# Patient Record
Sex: Female | Born: 1960 | Race: White | Hispanic: No | Marital: Married
Health system: Northeastern US, Academic
[De-identification: ages and names within clinical notes are randomized; demographics above are authoritative.]

---

## 2019-05-24 IMAGING — MG MAMMOGRAPHY SCREENING BILATERAL 3D TOMOSYNTHESIS WITH CAD
8 series · 8 of 24 positions shown · non-contrast
Comparison: Comparison was made to prior exams.

MAMMOGRAPHY SCREENING BILATERAL 3D TOMOSYNTHESIS WITH CAD, 05/24/2019 [DATE]: 
CLINICAL INDICATION: Screening exam.
TECHNIQUE: Digital bilateral mammograms and 3-D Tomosynthesis were obtained. 
These were interpreted both primarily and with the aid of computer-aided 
detection system.

[L CC]
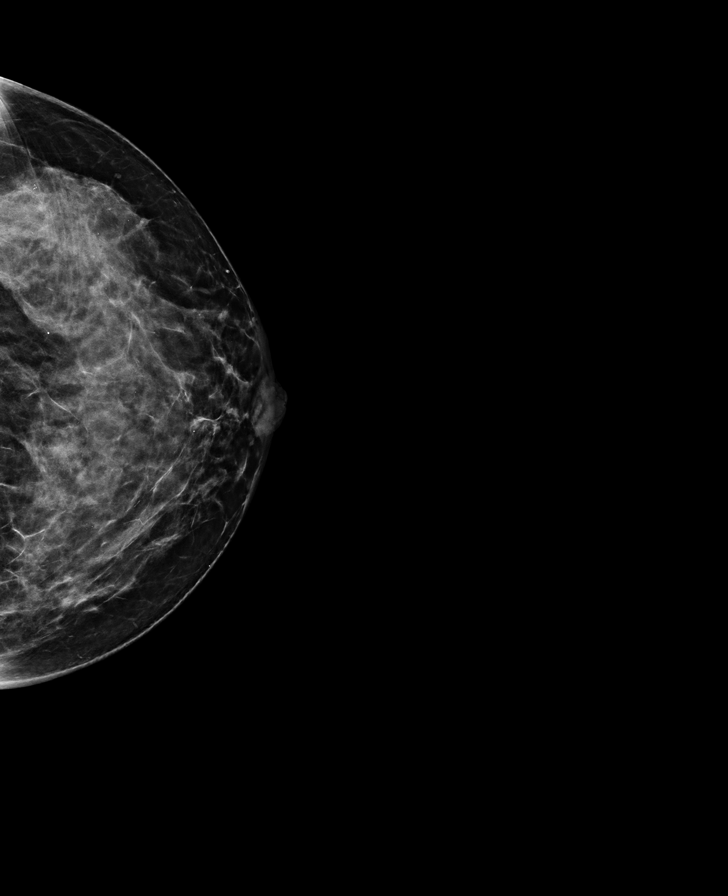

[L MLO]
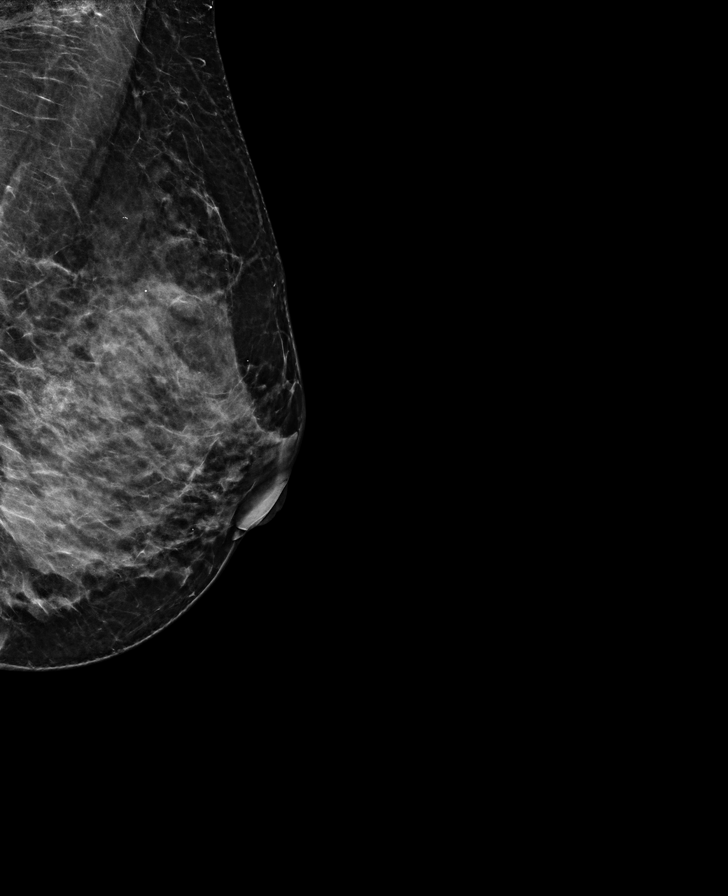

[R MLO]
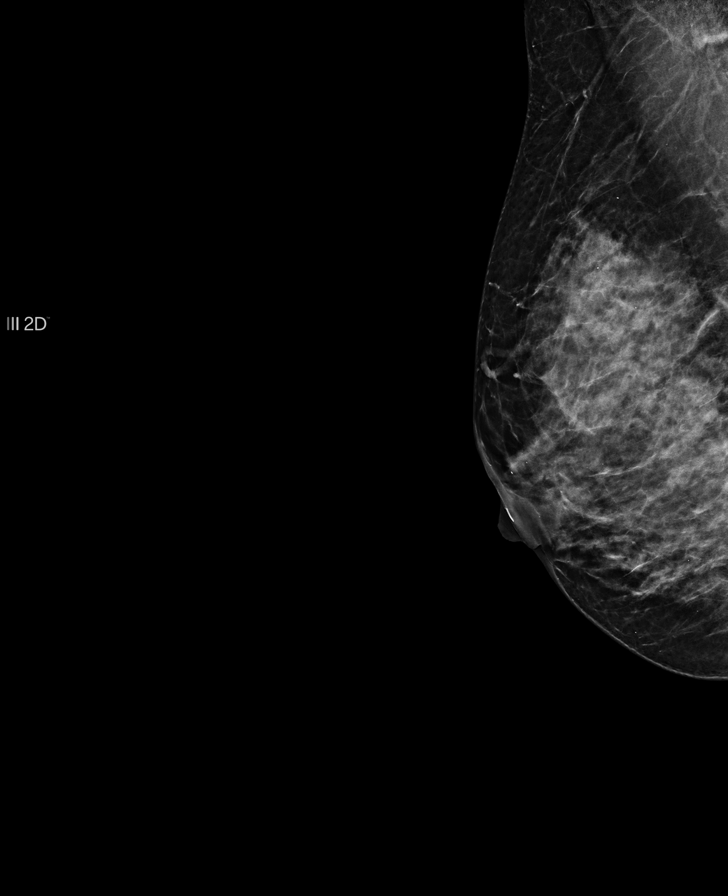

[R CC]
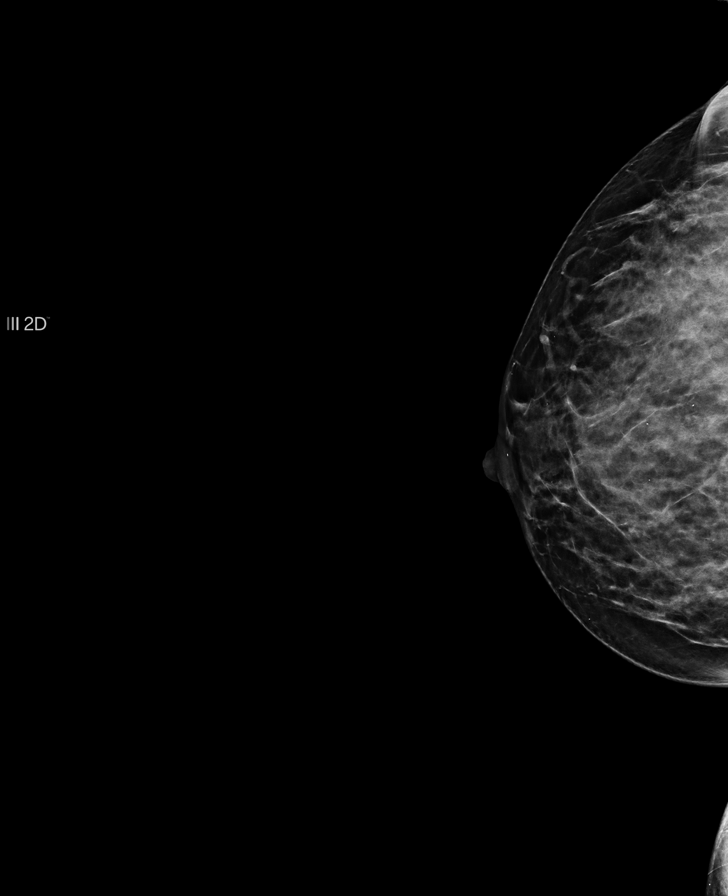

[R CC tomo · tomo slice 33/66.0]
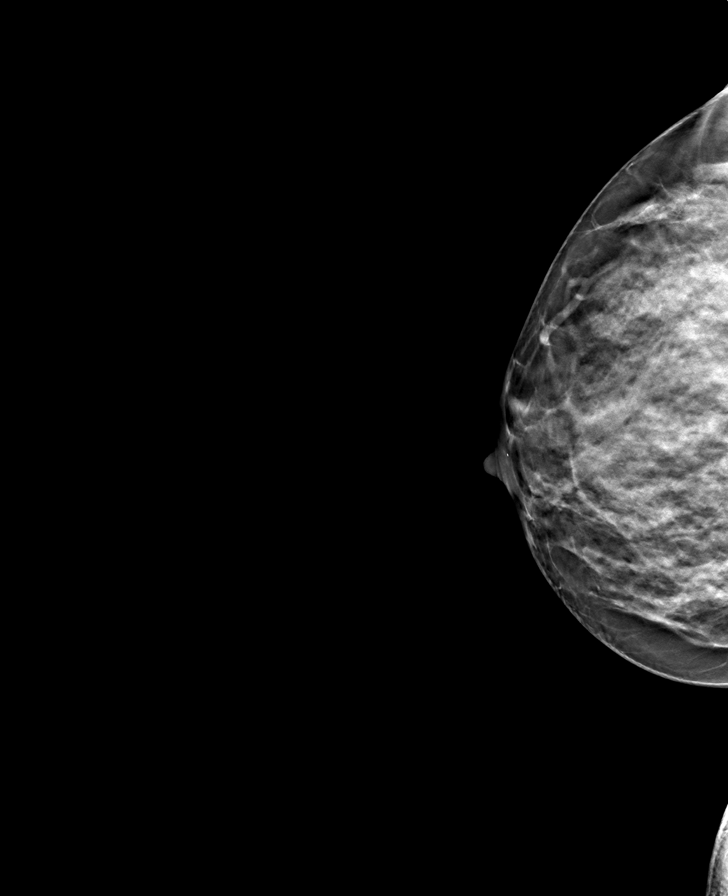

[R MLO tomo · tomo slice 32/63.0]
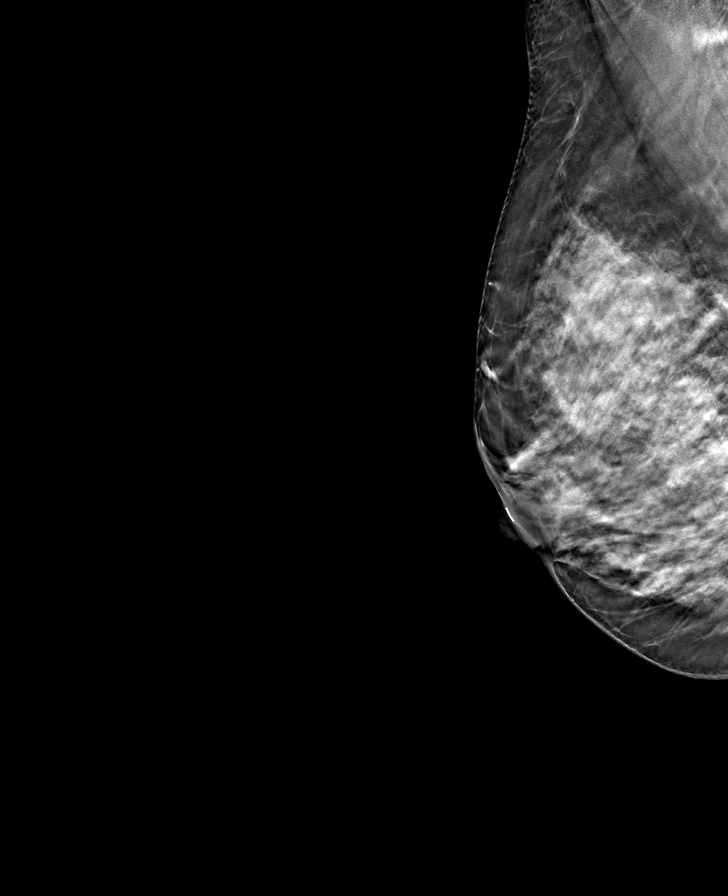

[L MLO tomo · tomo slice 33/64.0]
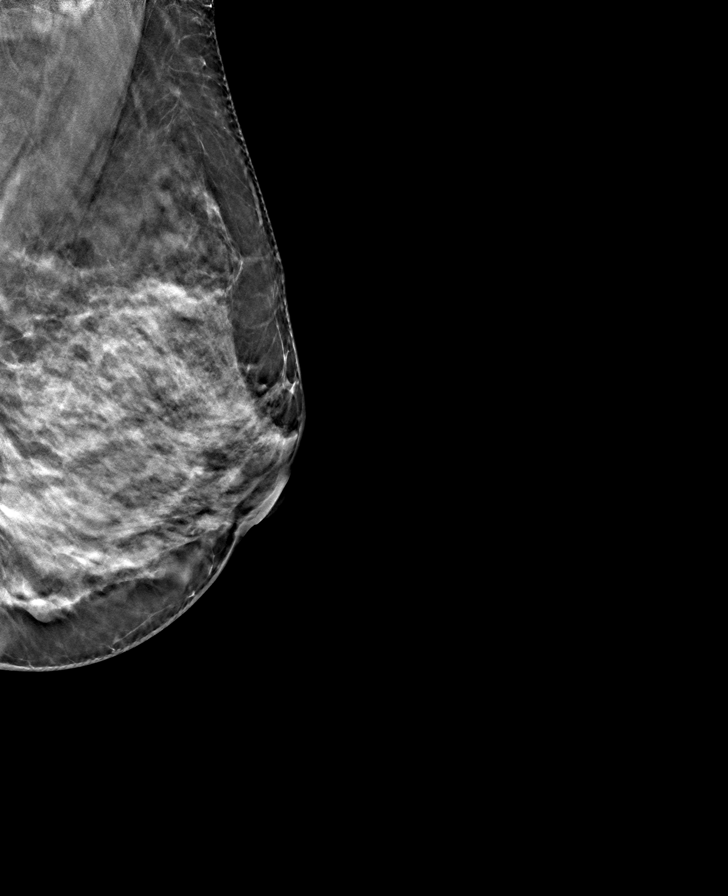

[L CC tomo · tomo slice 37/72.0]
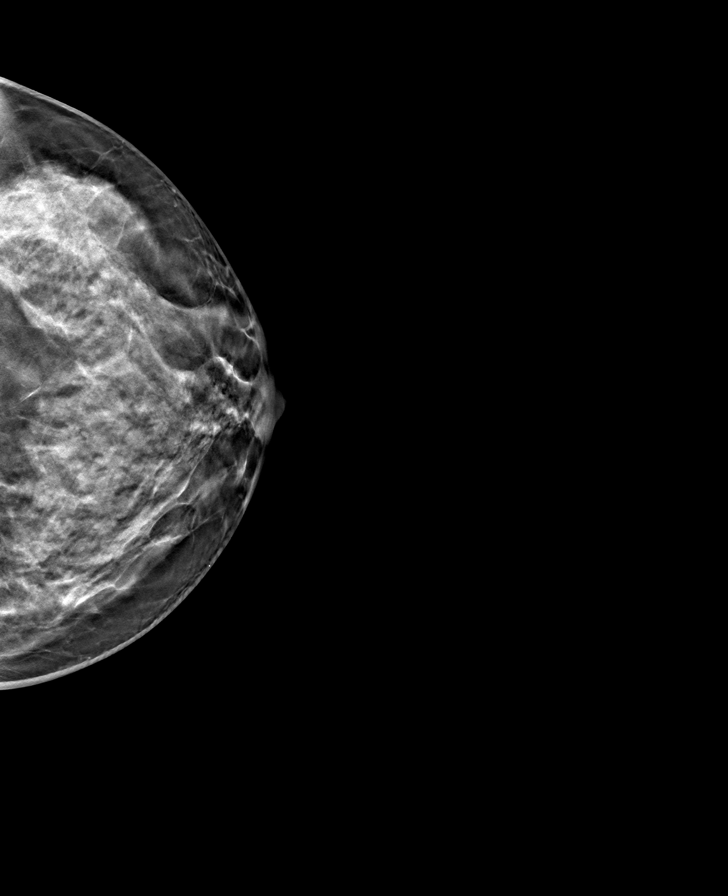

[8 of 24 positions shown; findings below may reference images not displayed]

BREAST DENSITY: (Level C) The breasts are heterogeneously dense, which may 
obscure small masses.
FINDINGS: No suspicious mass, calcifications, or area of architectural 
distortion in either breast.
IMPRESSION: No mammographic evidence of malignancy in either breast. 
( BI-RADS 1) Negative mammogram. Routine mammographic follow-up is recommended.

## 2019-11-03 IMAGING — MR MRI BRAIN WITHOUT CONTRAST
5 of 9 series · 26 of 48 positions shown · non-contrast
Comparison: None

MRI BRAIN WITHOUT CONTRAST, 11/03/2019 [DATE]: 
CLINICAL INDICATION: Seizures
TECHNIQUE: Axial T1, Axial T2, Axial FLAIR, Diffusion weighted images, Sagittal 
T1, and Coronal T2 MR images of the brain were performed without intravenous 
contrast enhancement.

[Series 101: survey · axial · 10.0mm · 0.98mm/px · z∈[+0,+125]mm · 2 of 5 slices shown]
[im 1/5]
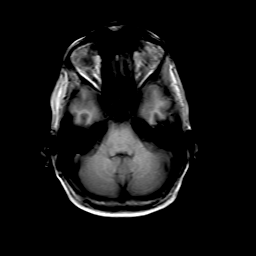
[im 5/5]
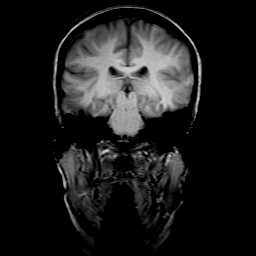

[Series 401: FLAIR · axial · 5.0mm · 0.65mm/px · z∈[-83,+63]mm · 4 of 27 slices shown]
[im 1/27]
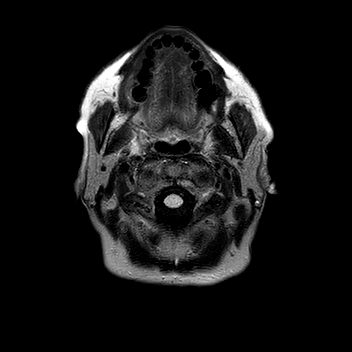
[im 9/27]
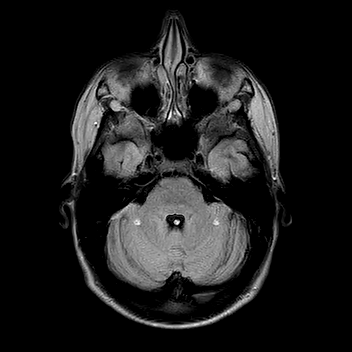
[im 18/27]
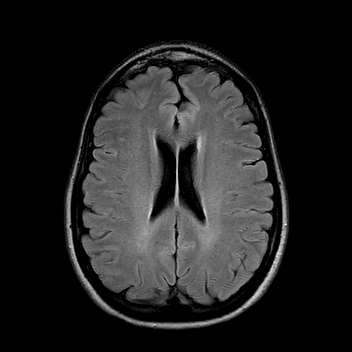
[im 27/27]
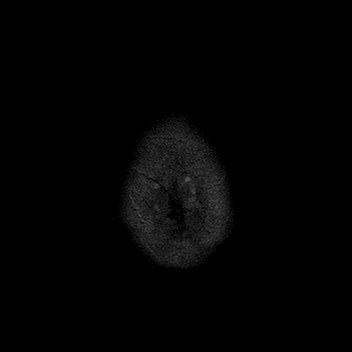

[Series 601: SWI · axial · 3.0mm · 0.53mm/px · z∈[-73,+62]mm · 10 of 103 slices shown]
[im 7/103]
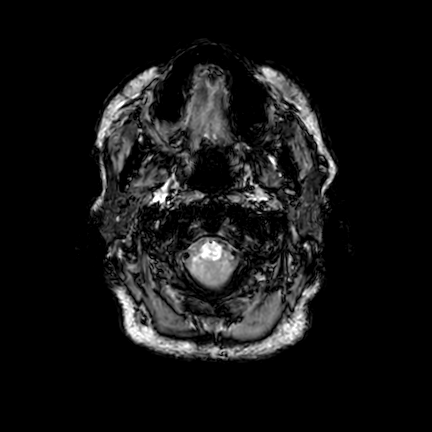
[im 14/103]
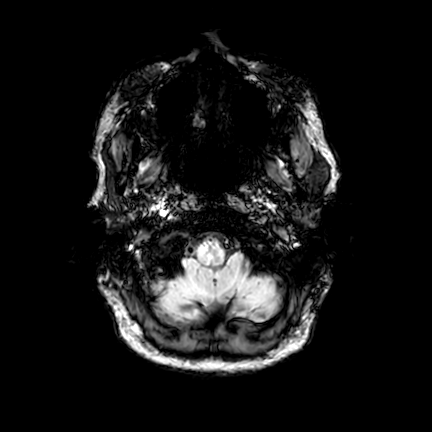
[im 21/103]
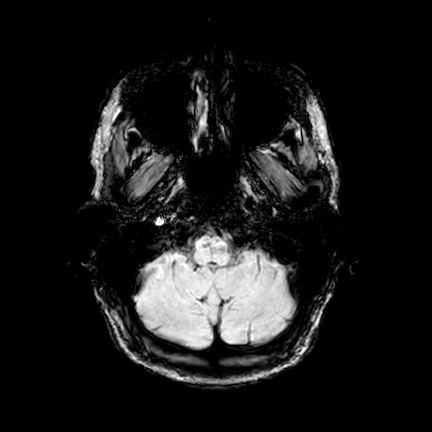
[im 35/103]
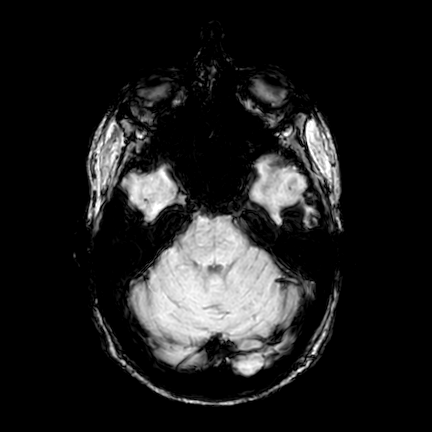
[im 48/103]
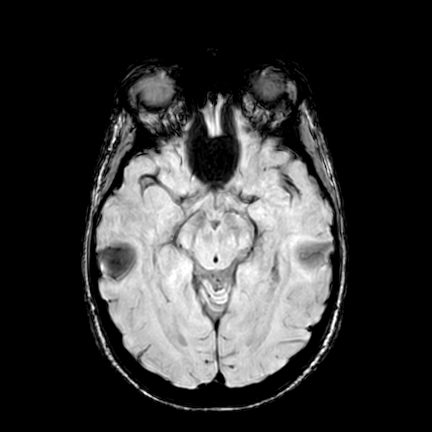
[im 55/103]
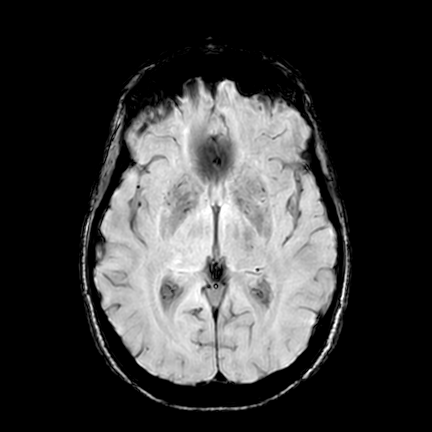
[im 62/103]
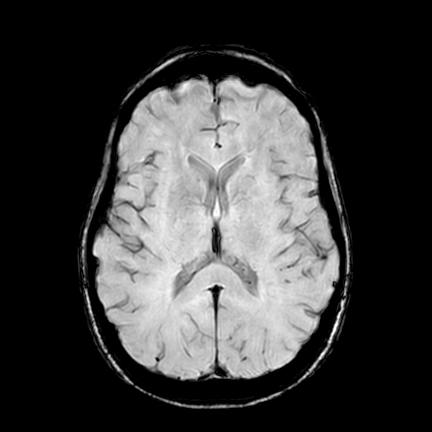
[im 75/103]
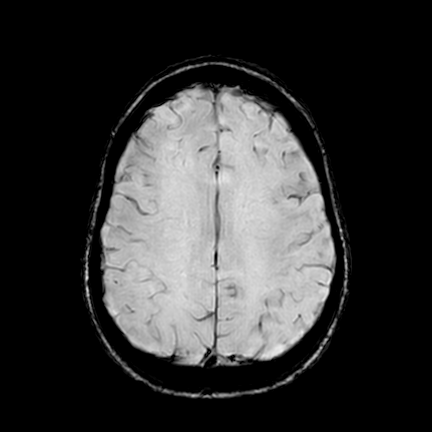
[im 89/103]
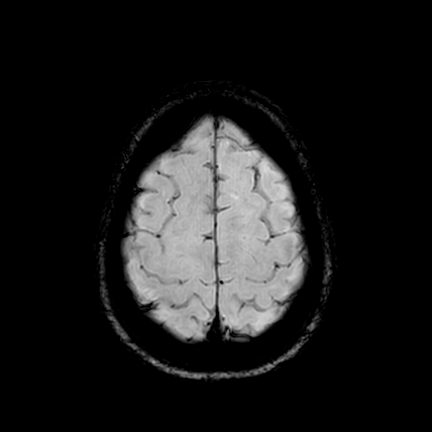
[im 103/103]
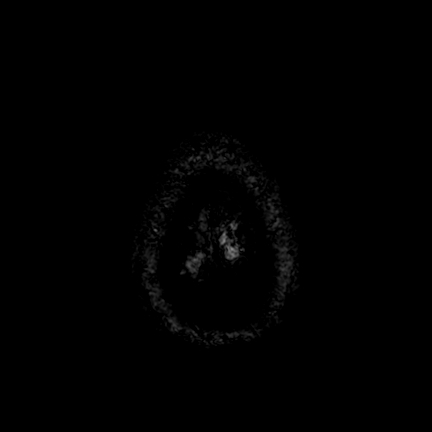

[Series 701: T2 · axial · 5.0mm · 0.41mm/px · z∈[-83,+63]mm · 4 of 27 slices shown (1 of 2)]
[im 1/27]
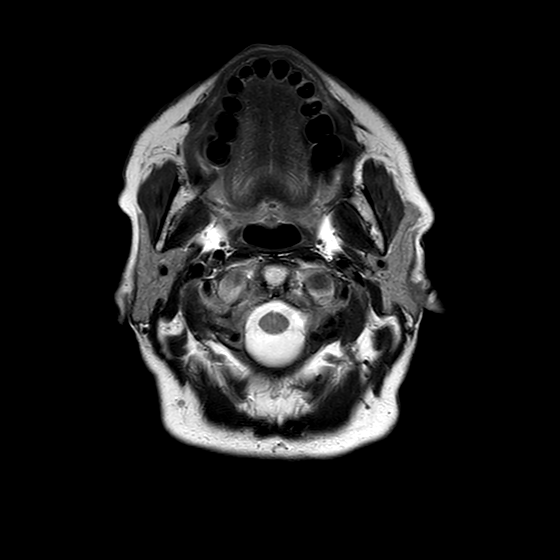
[im 9/27]
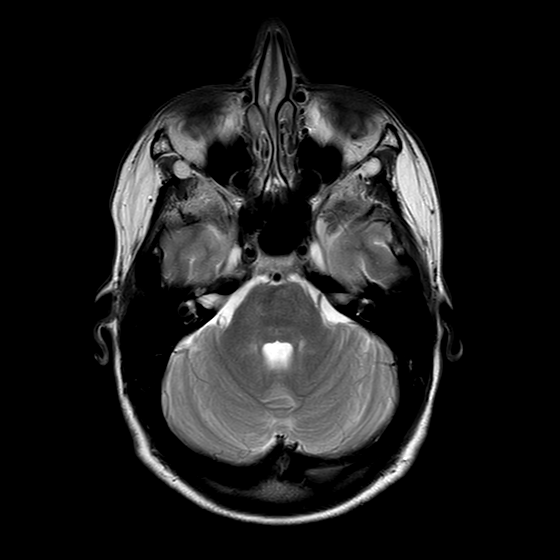
[im 18/27]
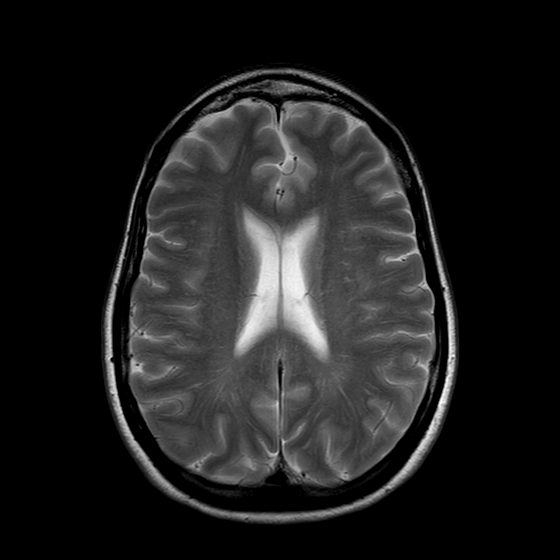
[im 27/27]
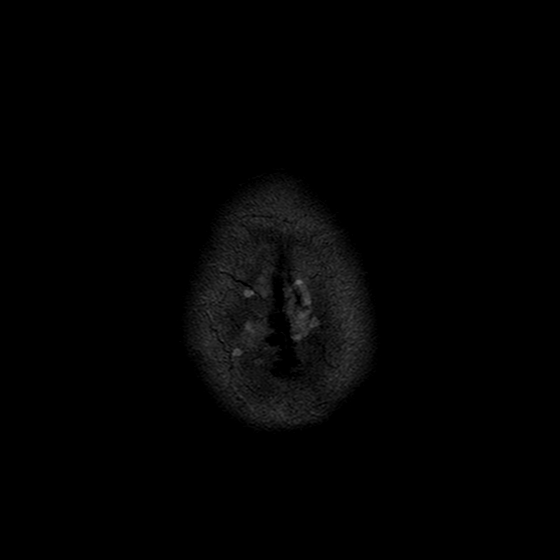

[Series 801: T2 · coronal · 4.0mm · 0.47mm/px · 6 of 36 slices shown (2 of 2)]
[im 1/36]
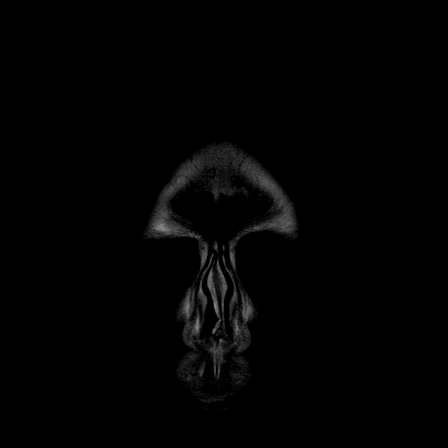
[im 8/36]
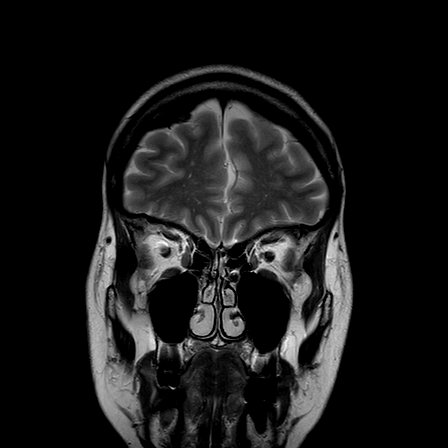
[im 15/36]
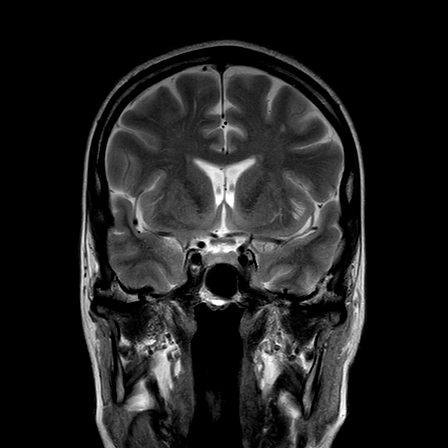
[im 22/36]
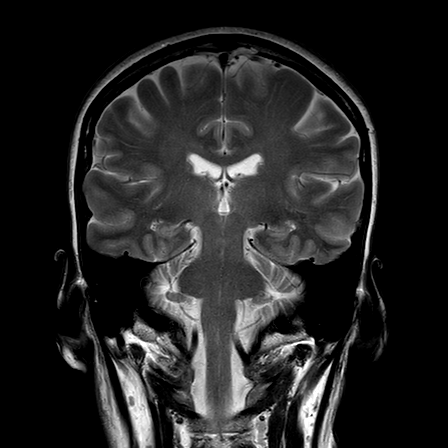
[im 29/36]
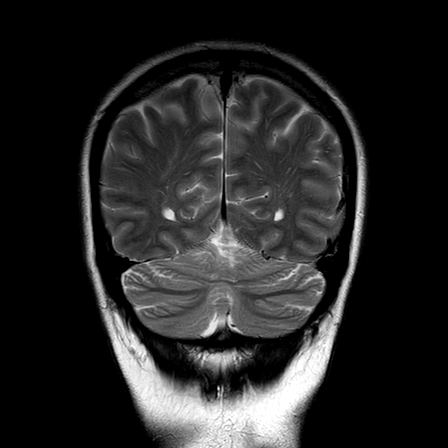
[im 36/36]
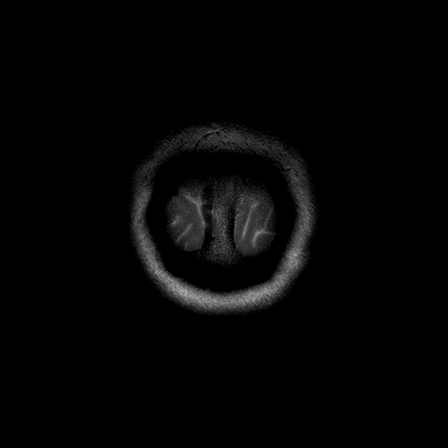

[26 of 48 positions shown; findings below may reference images not displayed]

FINDINGS: Diffusion images are negative. FLAIR sequences show mild to moderate 
chronic-appearing white matter microangiopathic changes, with scattered foci in 
the subcortical white matter, appearing most pronounced in the anterior 
subinsular zones bilaterally. There is no discrete brainstem or cerebellar 
lesion. No hydrocephalus. Major intracranial arterial segments are open. No 
aneurysm or vascular malformation. Major dural sinuses appear open. No evidence 
for neuronal migration anomaly. There is no significant atrophy. Hippocampal and 
mesiotemporal structures are unremarkable. The craniocervical junction is open. 
Sellar contents normal. Susceptibility images show no parenchymal hemosiderin. 
There is mild hyperostosis frontalis interna. Paranasal sinuses and otomastoid 
spaces appear clear. There is no orbital mass.
IMPRESSION: Unremarkable cranial MRI. No evidence for infarct, intracranial mass, migration 
anomaly, or other discrete epileptogenic focus. If warranted follow-up with 
enhanced images would be useful for further evaluation. 
Mild to moderate chronic-appearing cerebral white matter microangiopathic foci.

## 2019-11-03 IMAGING — MR MRI CERVICAL SPINE WITHOUT CONTRAST
5 series · 34 of 48 positions shown · IV contrast (gadolinium)
Comparison: None prior of the cervical spine.

MRI CERVICAL SPINE WITHOUT CONTRAST, 11/03/2019 [DATE]: 
CLINICAL INDICATION: History of grand mal seizures. Involuntary movement 
involving the neck and legs for 3 months. Last seizure 6 years ago. History of 
melanoma.
TECHNIQUE: Multiplanar, multiecho position MR images of the cervical spine were 
performed without intravenous gadolinium enhancement.

[Series 201: survey · axial · 10.0mm · 0.94mm/px · z∈[-44,+121]mm · 5 of 9 slices shown]
[im 1/9]
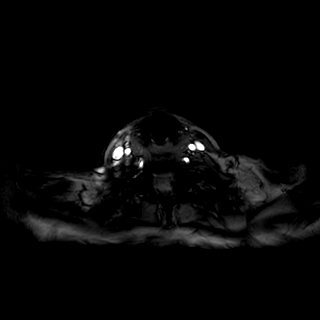
[im 3/9]
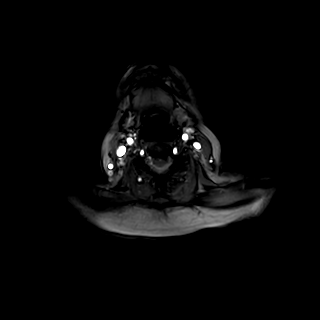
[im 5/9]
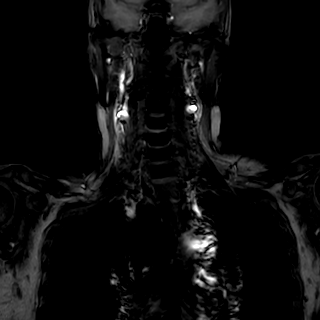
[im 7/9]
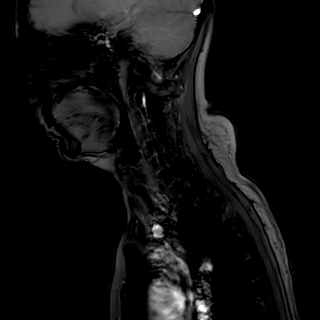
[im 9/9]
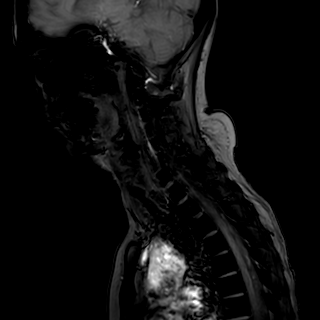

[Series 301: t1w_tse sag · sagittal · 3.0mm · 0.50mm/px · 7 of 15 slices shown]
[im 1/15]
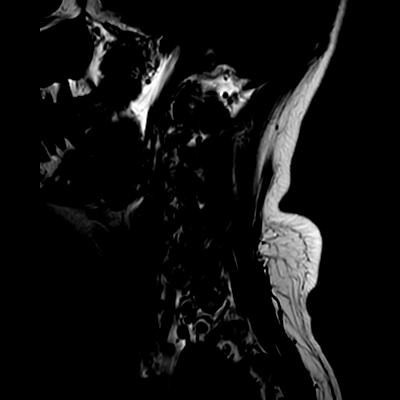
[im 3/15]
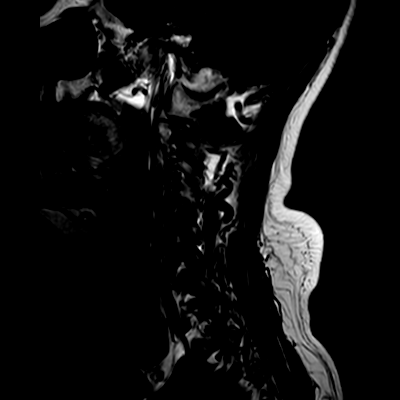
[im 5/15]
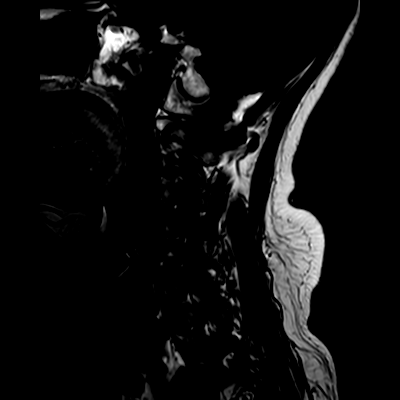
[im 8/15]
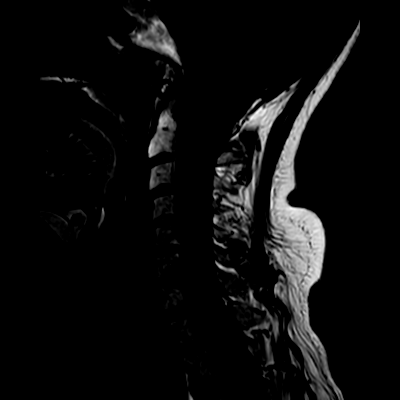
[im 10/15]
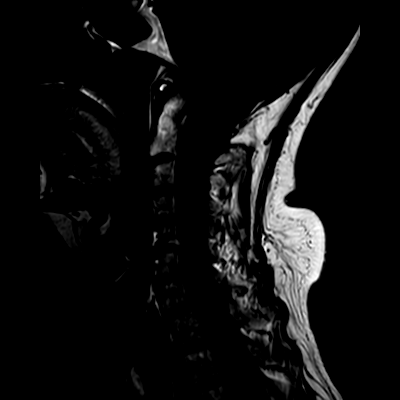
[im 12/15]
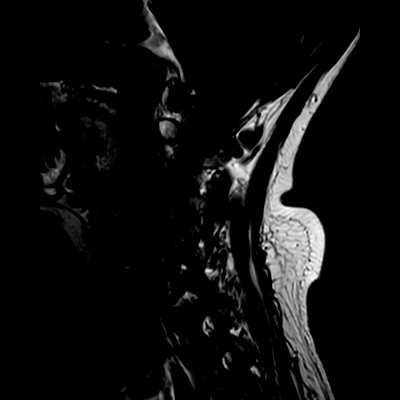
[im 15/15]
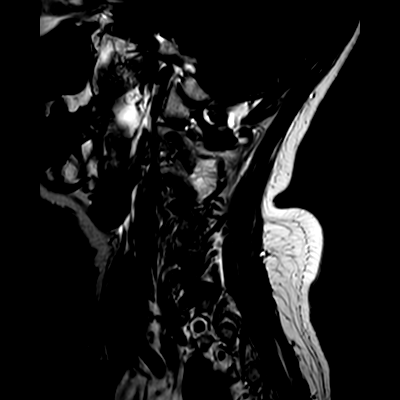

[Series 401: t2w_tse sag · sagittal · 3.0mm · 0.50mm/px · 7 of 15 slices shown]
[im 1/15]
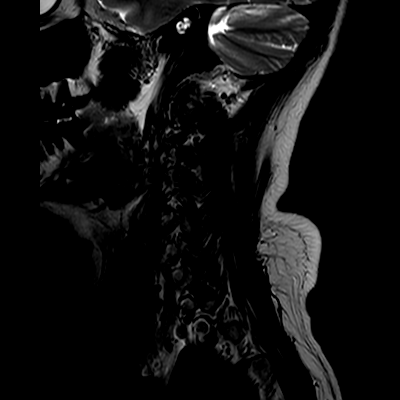
[im 3/15]
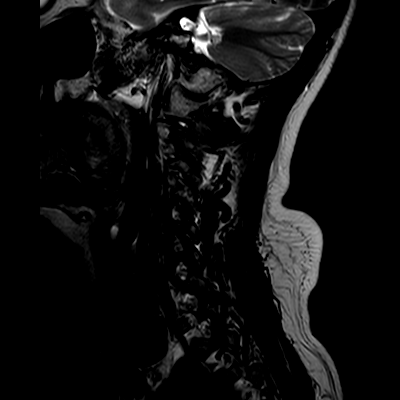
[im 5/15]
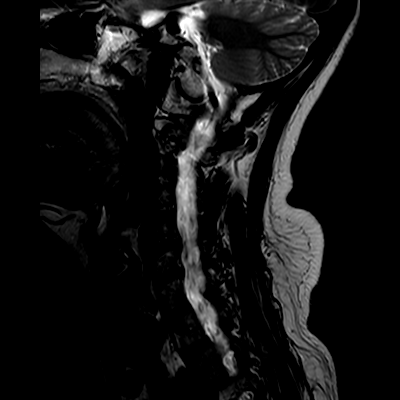
[im 8/15]
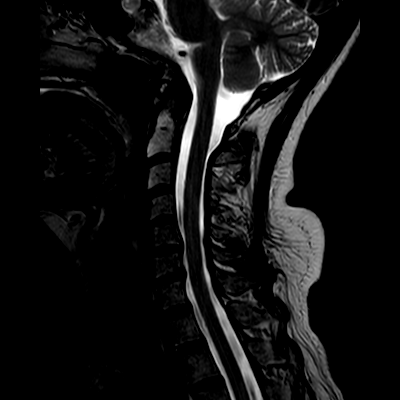
[im 10/15]
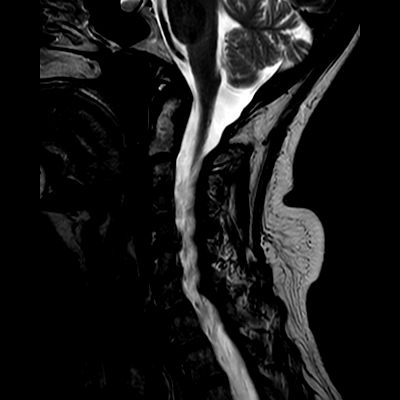
[im 12/15]
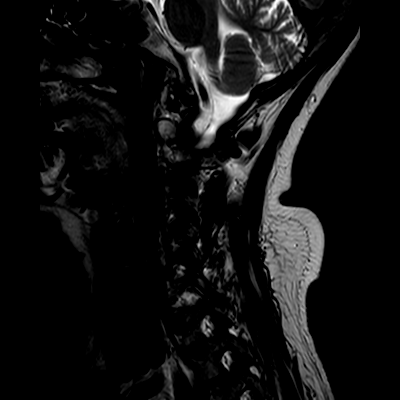
[im 15/15]
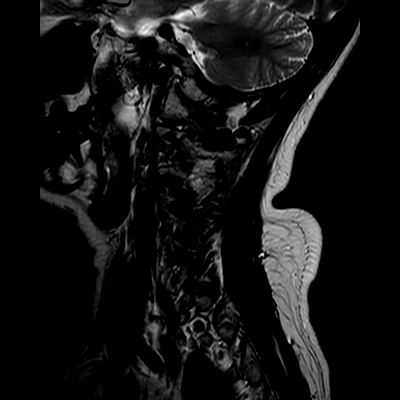

[Series 501: stir_longte sag · sagittal · 3.0mm · 0.62mm/px · 7 of 15 slices shown]
[im 1/15]
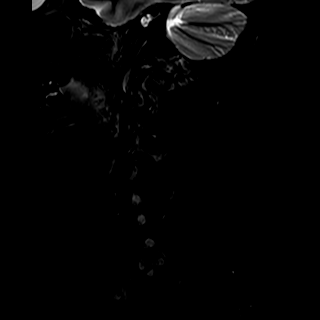
[im 3/15]
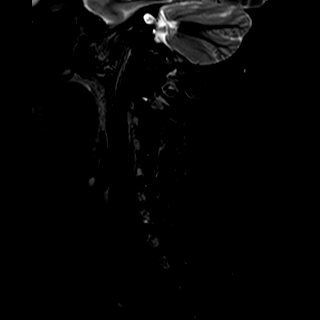
[im 5/15]
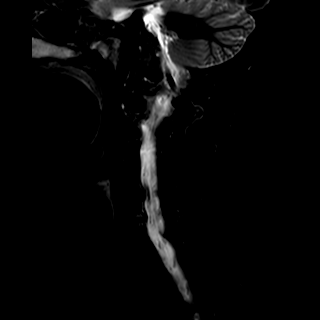
[im 8/15]
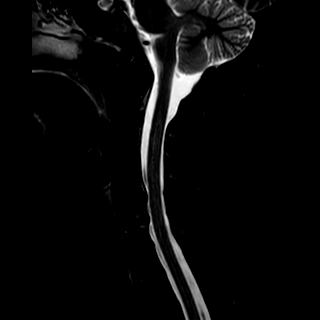
[im 10/15]
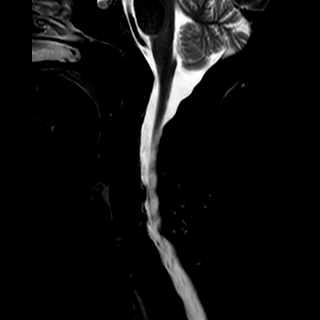
[im 12/15]
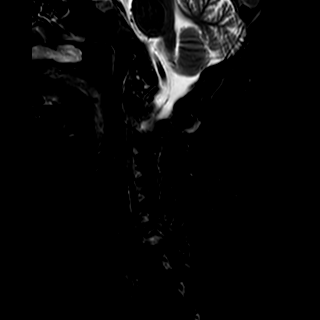
[im 15/15]
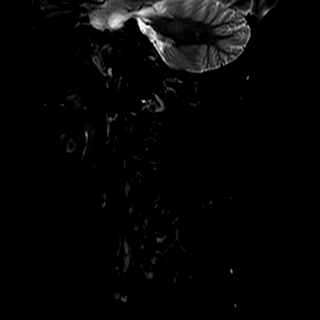

[Series 601: t2w_tse_ax · axial · 3.0mm · 0.36mm/px · z∈[-83,+27]mm · 8 of 45 slices shown]
[im 3/45]
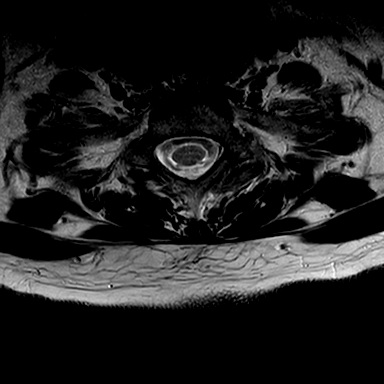
[im 9/45]
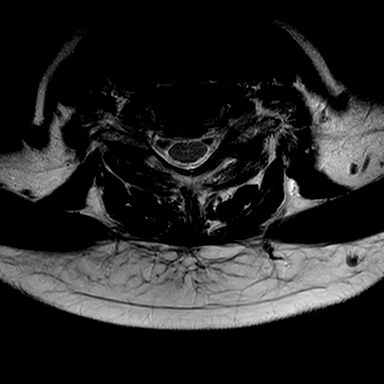
[im 13/45]
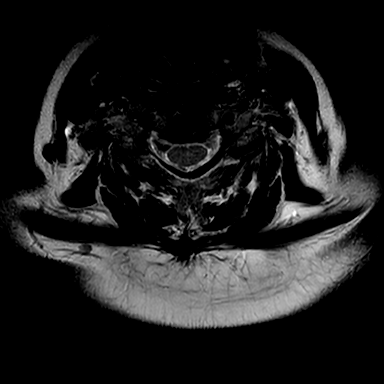
[im 19/45]
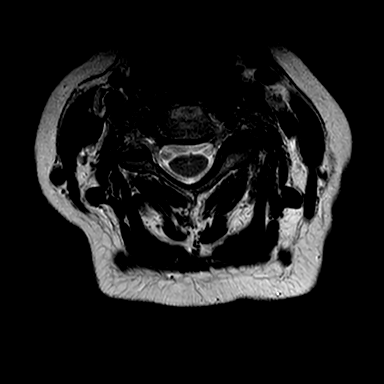
[im 26/45]
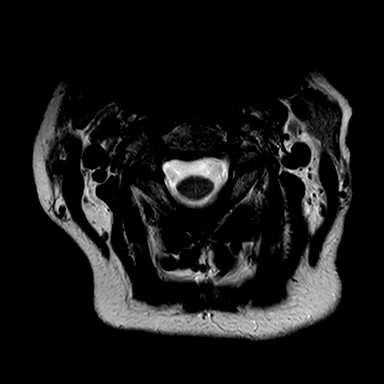
[im 32/45]
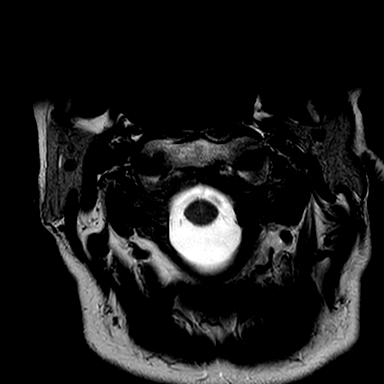
[im 36/45]
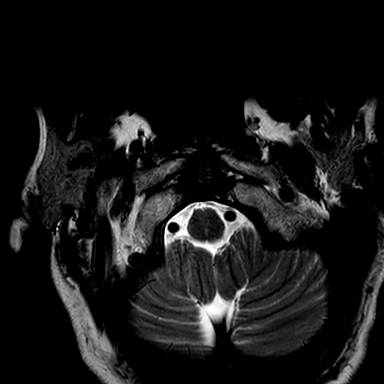
[im 42/45]
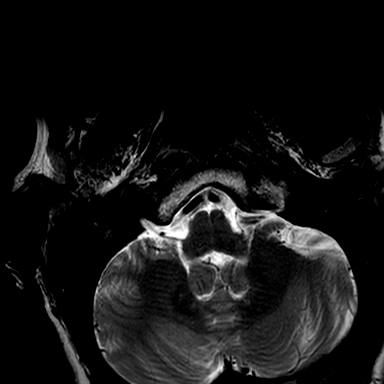

[34 of 48 positions shown; findings below may reference images not displayed]

FINDINGS: The vertebral bodies are normal in height and alignment. No vertebral 
body fracture. No spondylolisthesis. Normal bone marrow signal intensity. No 
signal abnormality or mass within the included portion of the spinal cord or 
spinal canal. The cerebellar tonsils are well located. The foramen magnum is 
negative. Included portions of the intracranial contents are negative. Posterior 
paraspinal soft tissues are negative. 
C2-C3: Mild disc desiccation without disc height loss. No disc herniation. 
Normal facets. No spinal canal or neural foraminal stenosis. 
C3-C4: Mild disc desiccation without disc height loss. Swelling arterial mild 
right posterior paracentral shallow disc protrusion. Normal facets. No spinal 
canal or neural foraminal stenosis. 
C4-C5: Mild disc desiccation without disc height loss. No disc herniation. Mild 
facet hypertrophy. No spinal canal or neural foraminal stenosis. 
C5-C6: Mild disc desiccation and disc height loss. Mild dorsal disc osteophyte 
complex. Mild facet hypertrophy. No spinal canal or neural foraminal stenosis. 
C6-C7: Mild disc desiccation and disc height loss. Mild dorsal disc osteophyte 
complex. Normal facets. No spinal canal or neural foraminal stenosis. 
C7-T1: Mild disc desiccation without disc height loss. No disc herniation. Mild 
facet hypertrophy. There is a small amount of edema like signal intensity within 
the marrow and soft tissues about the left facet is seen for example on the 
sagittal series, image 4.
IMPRESSION: 1.  Mild spondylotic changes cervical spine. 
2.  No spinal canal stenosis or neural foraminal stenosis. 
3.  There is facet arthropathy at C7-T1 with mild marrow and surrounding soft 
tissue edema about the left facet.

## 2020-05-10 ENCOUNTER — Encounter: Admit: 2020-05-10 | Payer: PRIVATE HEALTH INSURANCE | Attending: Vascular and Interventional Radiology

## 2020-05-24 IMAGING — MG MAMMOGRAPHY SCREENING BILATERAL 3D TOMOSYNTHESIS WITH CAD
6 series · 6 of 22 positions shown · non-contrast
Comparison: Comparison was made to prior exams.

MAMMOGRAPHY SCREENING BILATERAL 3D TOMOSYNTHESIS WITH CAD, 05/24/2020 [DATE]: 
CLINICAL INDICATION: Screening exam.
TECHNIQUE: Digital bilateral mammograms and 3-D Tomosynthesis were obtained. 
These were interpreted both primarily and with the aid of computer-aided 
detection system.

[R CC]
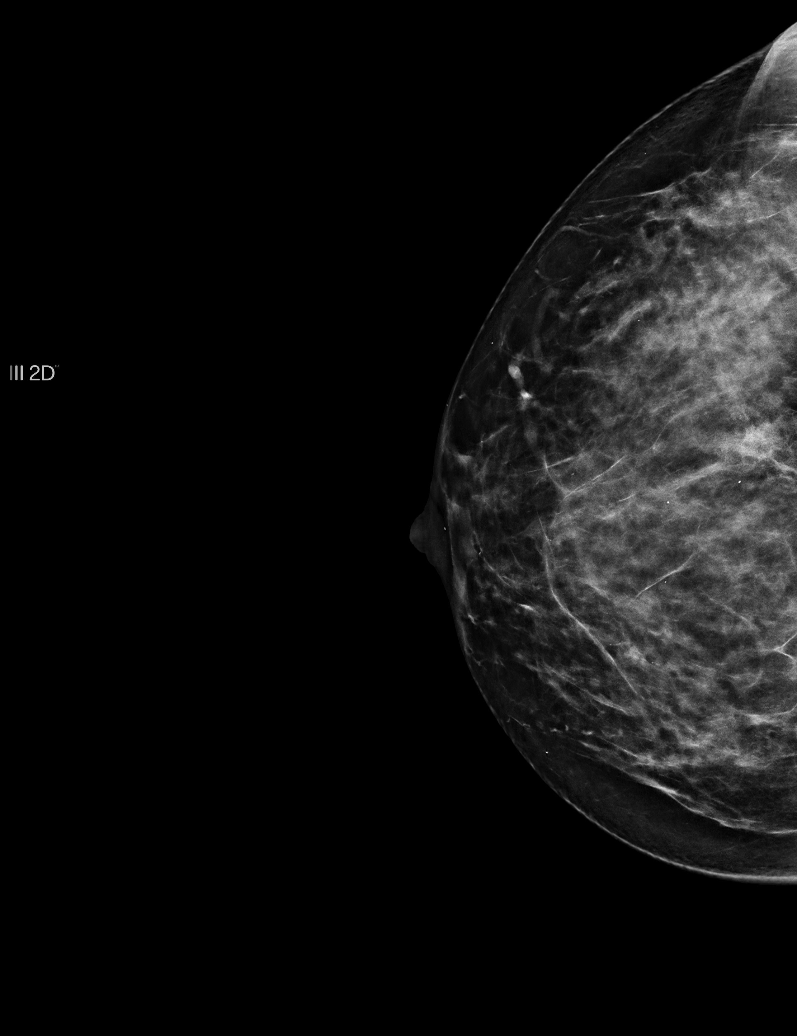

[L MLO]
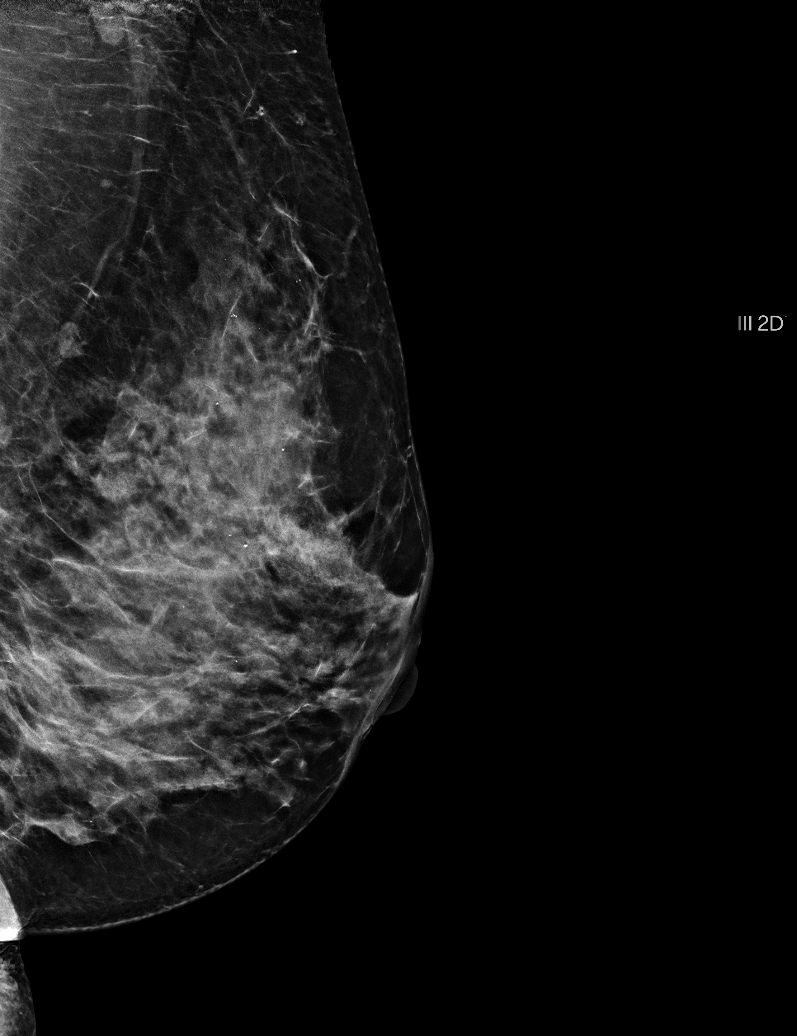

[L CC tomo · tomo slice 37/72.0]
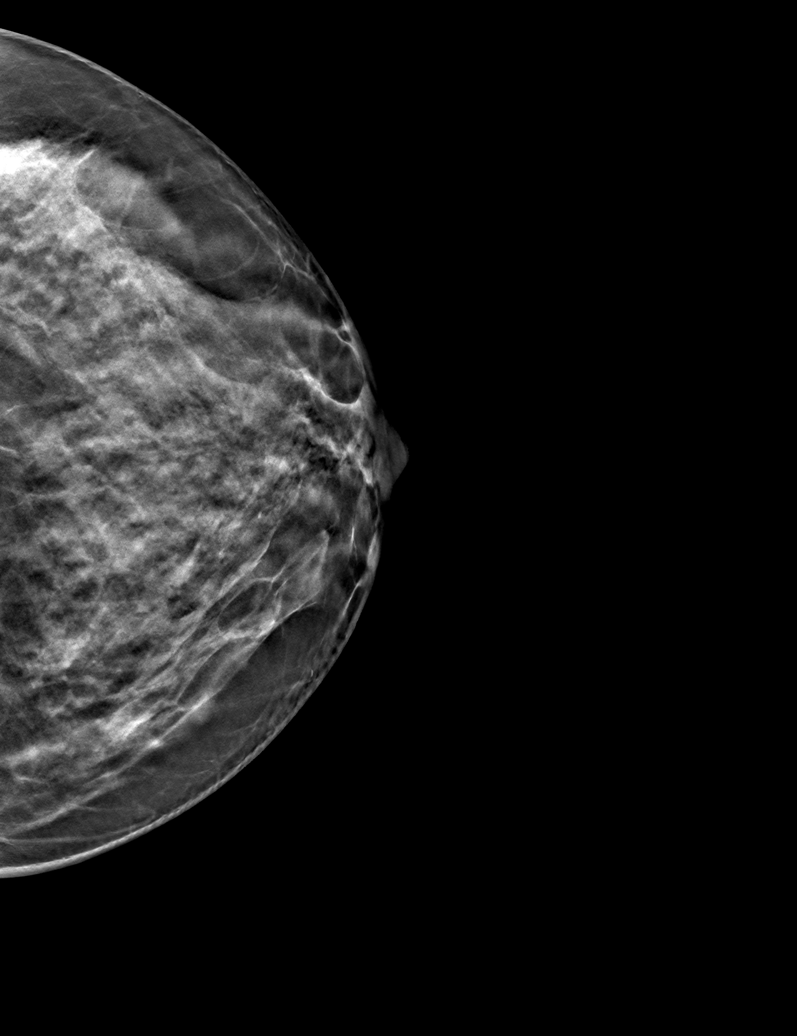

[L MLO tomo · tomo slice 36/71.0]
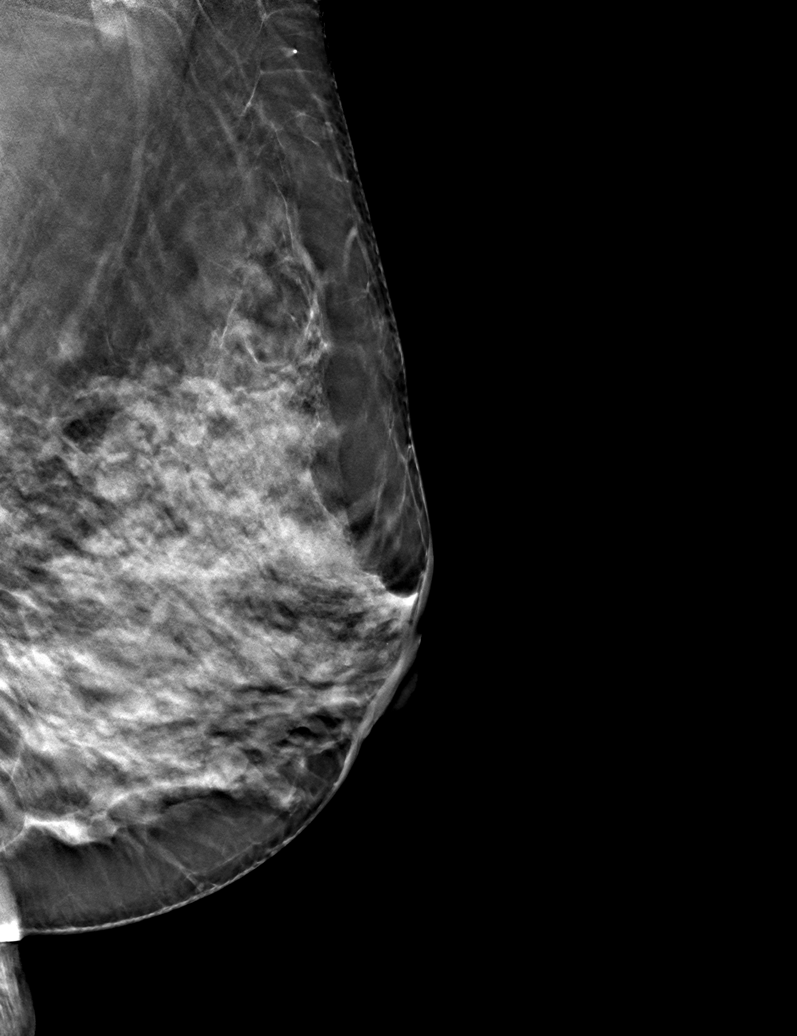

[R MLO tomo · tomo slice 39/76.0]
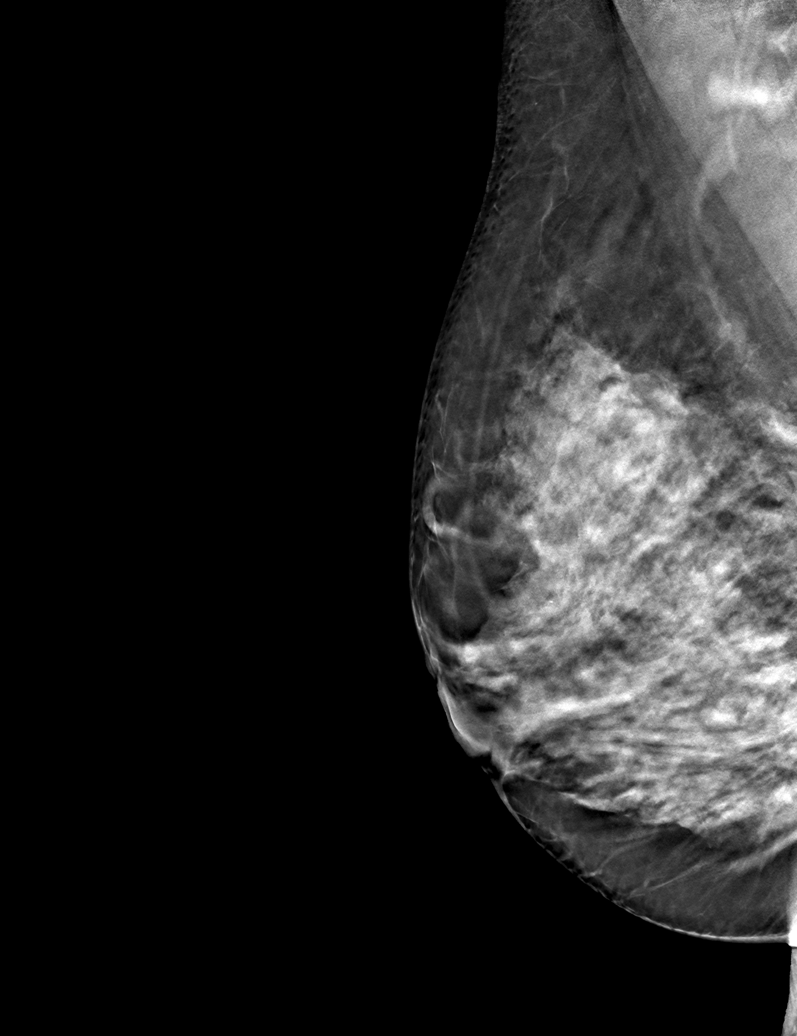

[R CC tomo · tomo slice 39/77.0]
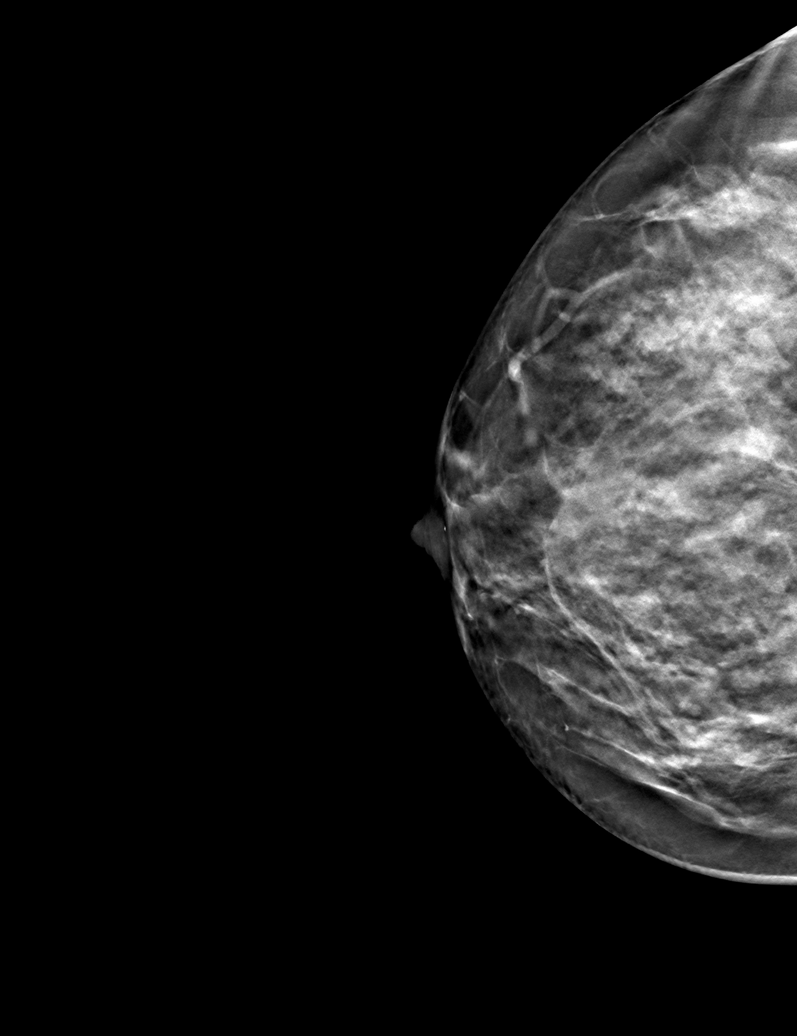

[6 of 22 positions shown; findings below may reference images not displayed]

BREAST DENSITY: (Level D) The breasts are extremely dense, which lowers the 
sensitivity of mammography.
FINDINGS: Extremely dense breast rectum is present.No suspicious mass, 
calcifications, or area of architectural distortion in either breast.
IMPRESSION: 1.  No mammographic evidence of malignancy in either breast. 
2.  Extremely dense breast parenchyma is present. This lowers the sensitivity of 
mammography for the detection of malignancy. Recommend calculating the patient's 
lifetime breast cancer risk via the NIH/NCI Breast cancer risk assessment tool: 
[URL] to determine which complementary modalities they 
may qualify for. 
( BI-RADS 2) Benign findings. Routine mammographic follow-up is recommended.

## 2020-06-21 ENCOUNTER — Encounter: Admit: 2020-06-21 | Payer: PRIVATE HEALTH INSURANCE | Attending: Vascular and Interventional Radiology

## 2020-06-26 ENCOUNTER — Encounter: Admit: 2020-06-26 | Payer: PRIVATE HEALTH INSURANCE | Attending: Vascular and Interventional Radiology

## 2020-07-03 ENCOUNTER — Encounter: Admit: 2020-07-03 | Payer: PRIVATE HEALTH INSURANCE | Attending: Vascular and Interventional Radiology

## 2020-07-03 IMAGING — CT PET CT SCAN TUMOR IMAGING SKULL TO THIGH
1 of 2 series · 1 of 25 positions shown · non-contrast
Comparison: No pertinent prior examination

PET CT SCAN TUMOR IMAGING SKULL TO THIGH, 07/03/2020 [DATE]: 
CLINICAL INDICATION:  Cutaneous T-cell lymphoma, positive biopsy on abdomen, 
initial staging
TECHNIQUE: A dose of 12.8 millicuries of 18-FDG was administered intravenously 
and skull to thigh PET scanning was performed at 60 minutes. Tomographic scans 
were reconstructed in axial, coronal, and sagittal projections. The data was 
reconstructed into a three-dimensional volume rendered images and reviewed in a 
rotational cine loop. Serum blood glucose at the time of injection was 104 
mg/dl.

[Series 6959: (wb_ctac) body · axial · 4.0mm · 4.00mm/px · 1 of 255 slices shown]
[im 139/255]
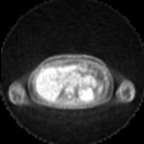

[1 of 25 positions shown; findings below may reference images not displayed]

FINDINGS: There is no abnormal FDG activity in cervical node bearing regions. 
The lower scalp, face and neck show no abnormal cutaneous activity. There is 
bilateral activity in the thyroid glands which are mildly enlarged. Clinical 
correlation advised as to potential thyroiditis. There is no abnormal pulmonary, 
mediastinal, axillary or supraclavicular FDG uptake. No focal concerning breast 
activity. There is physiologic excretion of contrast material by the left kidney 
into the bladder. The right kidney is absent. There is no concerning 
infradiaphragmatic or osseous activity. The spleen is not enlarged, vertical 
span 9.3 cm. No abnormal cutaneous uptake involving the thorax, abdomen or 
pelvis. There is pan colonic diverticulosis. There is no evidence for 
retroperitoneal or intra-abdominal adenopathy. There is no suspicious osseous 
activity. Uptake along the base of the left thumb is likely degenerative.
IMPRESSION: No abnormal FDG activity identified to indicate neoplastic involvement. There is 
no abnormal cutaneous uptake along the abdomen or elsewhere. The upper scalp and 
the legs were not included on today's examination. 
Symmetric activity in enlarged thyroid gland could indicate thyroiditis, 
[place] versus goiter; clinical correlation. 
Low-dose CT images of the lungs show no pulmonary nodule or pleural effusion. 
There is moderate pan colonic diverticulosis. 
Mild aortic plaque. Degenerative changes.

## 2020-08-02 ENCOUNTER — Encounter: Admit: 2020-08-02 | Payer: PRIVATE HEALTH INSURANCE | Attending: Vascular and Interventional Radiology

## 2021-01-04 ENCOUNTER — Encounter: Admit: 2021-01-04 | Payer: PRIVATE HEALTH INSURANCE | Attending: Vascular and Interventional Radiology

## 2021-01-08 ENCOUNTER — Inpatient Hospital Stay: Admit: 2021-01-08 | Discharge: 2021-01-08 | Payer: PRIVATE HEALTH INSURANCE

## 2021-01-08 ENCOUNTER — Encounter: Admit: 2021-01-08 | Payer: PRIVATE HEALTH INSURANCE | Attending: Vascular and Interventional Radiology

## 2021-01-08 IMAGING — MR MRI BRAIN W/WO CONTRAST
6 of 11 series · 27 of 48 positions shown · IV contrast (gadavist)
Comparison: Brain MRI November 03, 2019

FINAL Diagnostic Imaging Report 
________________________________________________________________________________________________ 
MRI BRAIN W/WO CONTRAST,01/08/2021 [DATE]: 
CLINICAL INDICATION: Myoclonus, right leg and shoulder twitching, history of 
seizure 10 years ago
TECHNIQUE: Axial T1, Axial T2, Axial FLAIR, Diffusion weighted images, Sagittal 
T1, Enhanced Axial T1, and Enhanced coronal fat-suppressed T1 were obtained. 8 
ccs of Gadavist was injected intravenously by hand.

[Series 3: DWI · axial · 5.0mm · 1.09mm/px · z∈[-41,+114]mm · 9 of 54 slices shown (1 of 2)]
[im 1/54]
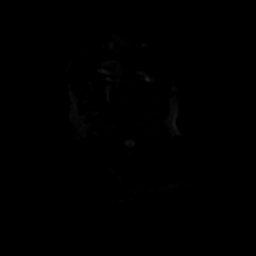
[im 7/54]
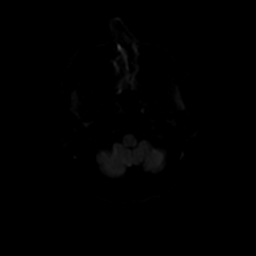
[im 14/54]
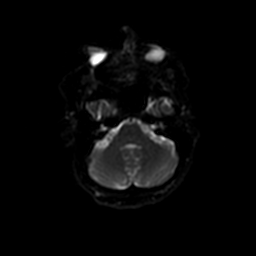
[im 20/54]
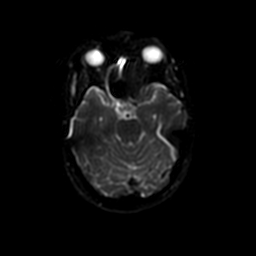
[im 27/54]
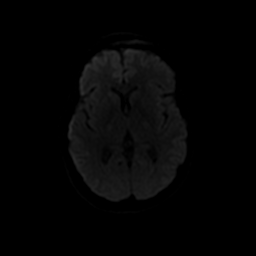
[im 34/54]
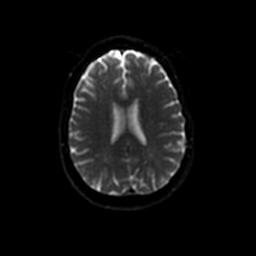
[im 40/54]
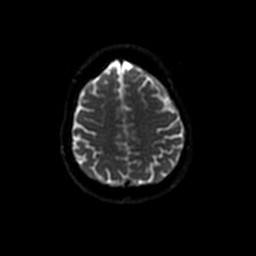
[im 47/54]
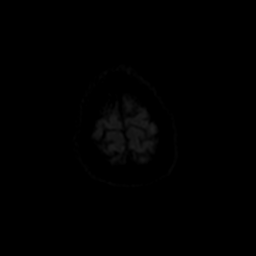
[im 54/54]
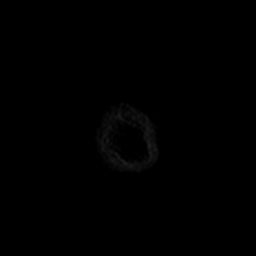

[Series 6: FLAIR · axial · 5.0mm · 0.43mm/px · z∈[-48,+102]mm · 4 of 26 slices shown]
[im 1/26]
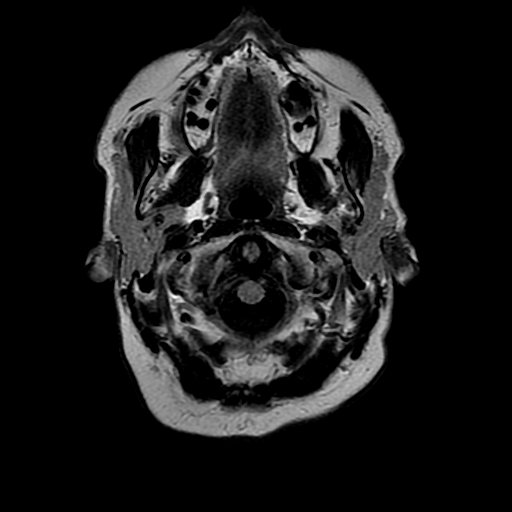
[im 9/26]
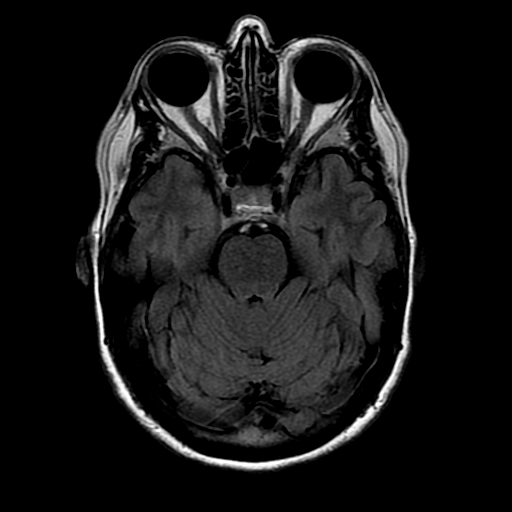
[im 17/26]
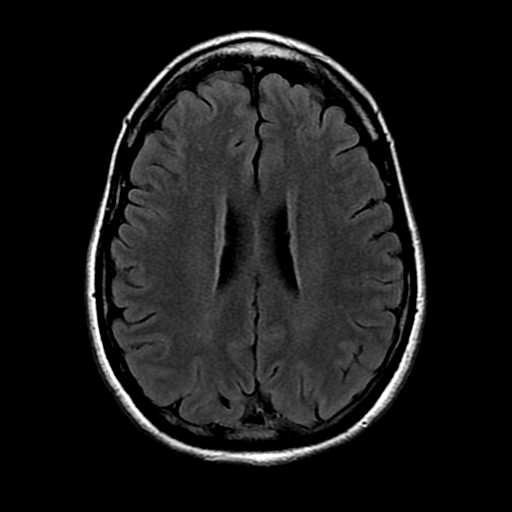
[im 26/26]
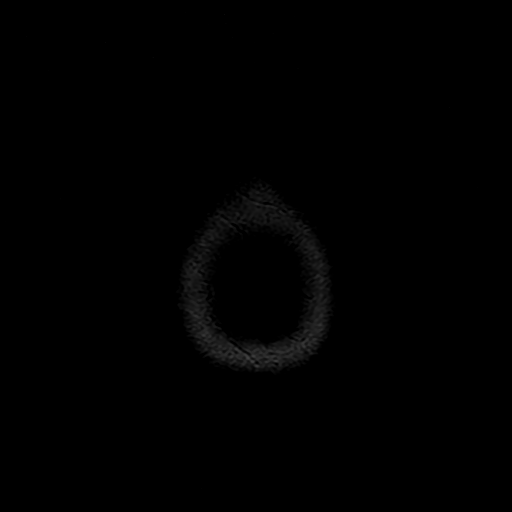

[Series 9: T1 post-contrast · axial · 5.0mm · 0.43mm/px · z∈[-48,+102]mm · 4 of 26 slices shown (1 of 3)]
[im 1/26]
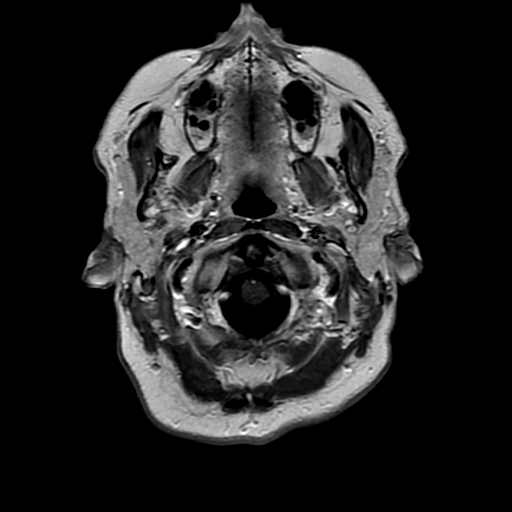
[im 9/26]
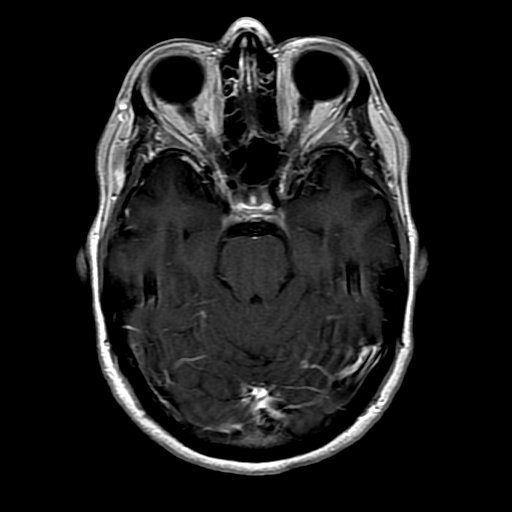
[im 17/26]
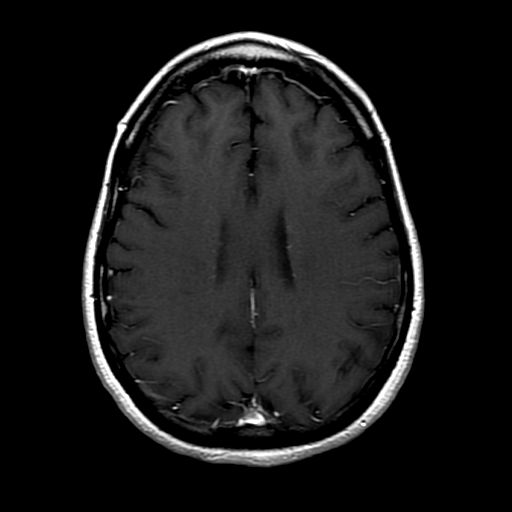
[im 26/26]
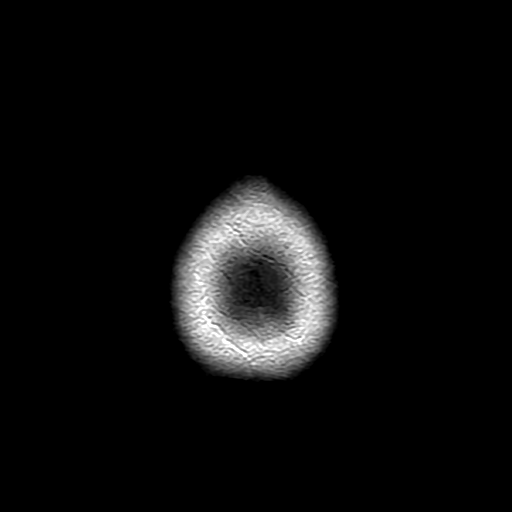

[Series 10: T1 post-contrast · coronal · 5.0mm · 0.43mm/px · 5 of 30 slices shown (2 of 3)]
[im 1/30]
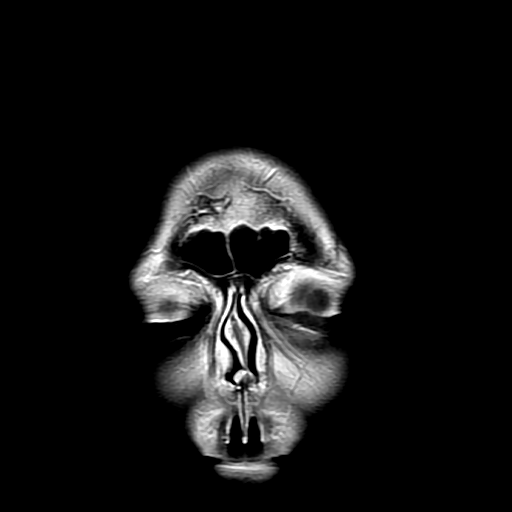
[im 8/30]
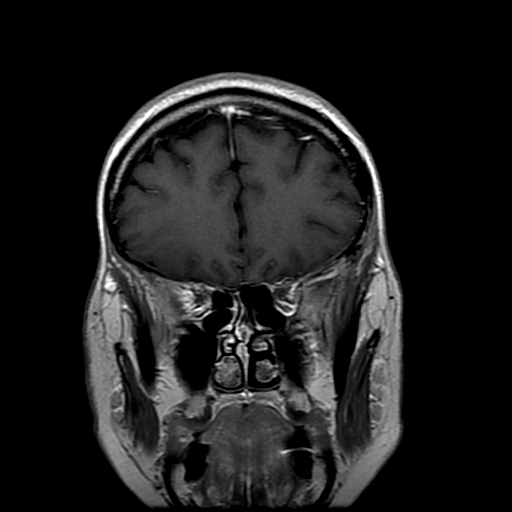
[im 15/30]
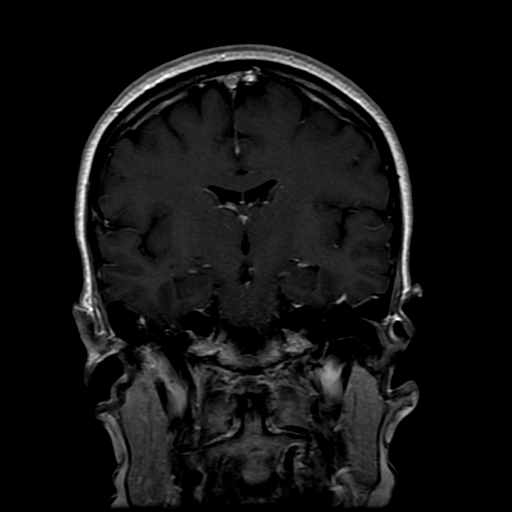
[im 22/30]
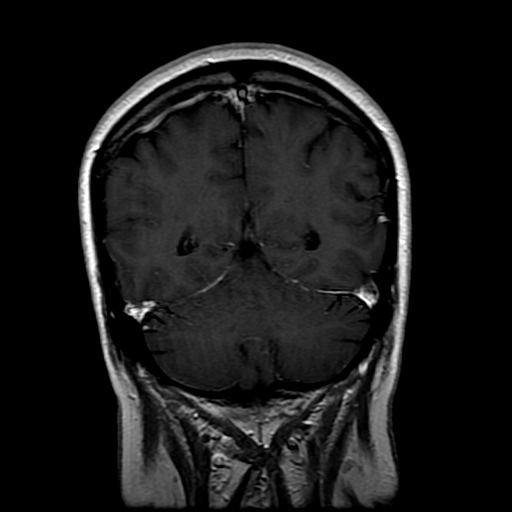
[im 30/30]
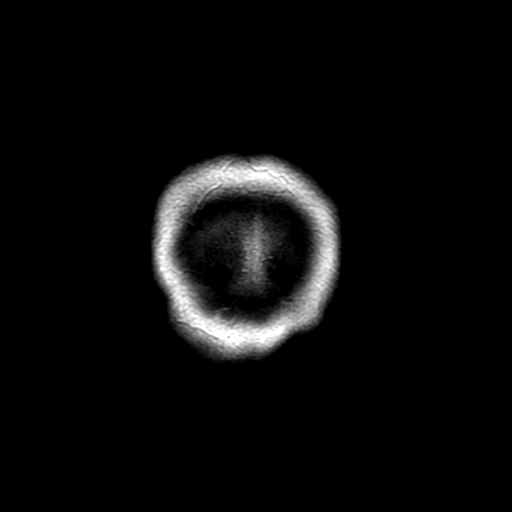

[Series 11: T1 post-contrast · sagittal · 4.0mm · 0.43mm/px · 1 of 28 slices shown (3 of 3)]
[im 1/28]
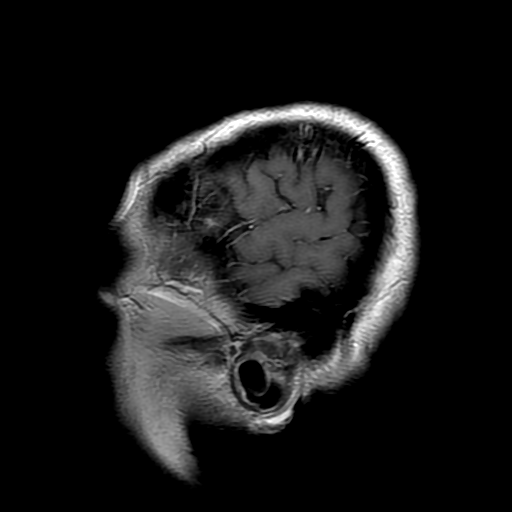

[Series 300: DWI · axial · 5.0mm · 1.09mm/px · z∈[-41,+114]mm · 4 of 27 slices shown (2 of 2)]
[im 1/27]
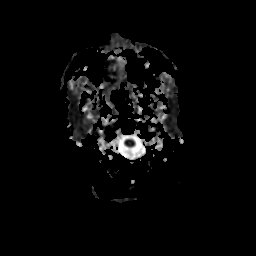
[im 9/27]
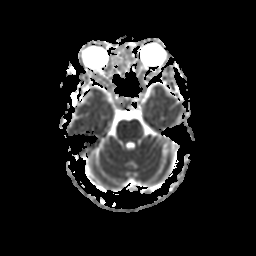
[im 18/27]
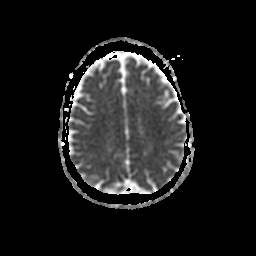
[im 27/27]
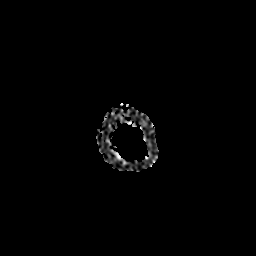

[27 of 48 positions shown; findings below may reference images not displayed]

FINDINGS: Diffusion images are negative. There are mild T2 hyperintense signal 
changes in the frontal white matter, most pronounced in the anterior subinsular 
white matter and along the anterolateral corona radiata. Sparse foci in the 
anterior centrum semiovale in the subcortical zones. These findings are stable 
compared to the October 2019 MRI. These findings are most likely chronic 
microangiopathy. Gradient echo images show no parenchymal hemosiderin. There is 
no hydrocephalus. 
There is no discrete ganglionic, brainstem or cerebellar lesion allowing for 
mild motion artifact. There is no significant atrophy. The craniocervical 
junction is open. Sellar contents are unremarkable. Corpus callosum is intact. 
There is mild hyperostosis frontalis interna, best seen on axial image 19. There 
is no pathologic enhancement or intracranial mass. 
Major arterial segments are open. No evidence for aneurysm or vascular 
malformation. Dural sinuses are open. There is no orbital mass. Paranasal 
sinuses, tympanic cavities and mastoid air cells are clear.
IMPRESSION: Mild T2 hyperintense signal foci in the frontal white matter appear stable 
compared to the prior study. These findings are nonspecific but likely represent 
chronic small vessel change. 
No evidence for intracranial mass, hydrocephalus or vascular malformation. No 
neuronal migration anomaly. No discrete brainstem or ganglionic lesion. 
Major intracranial arterial segments and dural sinuses appear open.

## 2021-02-28 ENCOUNTER — Encounter: Admit: 2021-02-28 | Payer: PRIVATE HEALTH INSURANCE | Attending: Vascular and Interventional Radiology

## 2021-04-25 ENCOUNTER — Encounter: Admit: 2021-04-25 | Payer: PRIVATE HEALTH INSURANCE | Attending: Vascular and Interventional Radiology

## 2021-07-04 IMAGING — MG MAMMOGRAPHY SCREENING BILATERAL 3[PERSON_NAME]
8 series · 9 of 24 positions shown · non-contrast
Comparison: 05/24/2020 and dating back to 06/05/2015.

________________________________________________________________________________________________ 
MAMMOGRAPHY SCREENING BILATERAL 3GAREITSANE TEKANYO, 07/04/2021 [DATE]: 
CLINICAL INDICATION: Screening.
TECHNIQUE: Digital bilateral mammograms and 3-D Tomosynthesis were obtained. 
These were interpreted both primarily and with the aid of computer-aided 
detection system.  
BREAST DENSITY: (Level C) The breasts are heterogeneously dense, which may 
obscure small masses.

[L MLO]
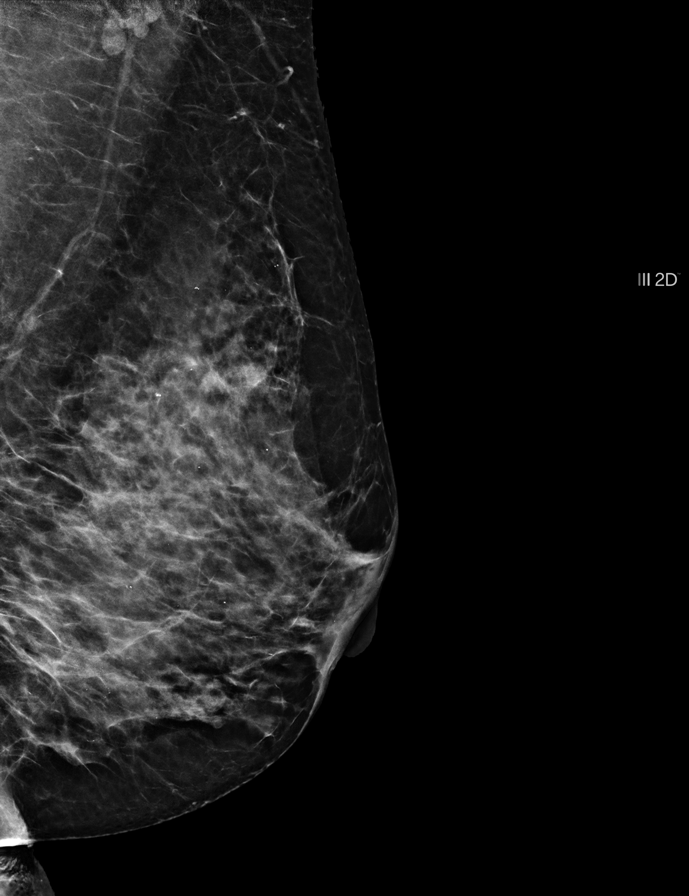

[L CC]
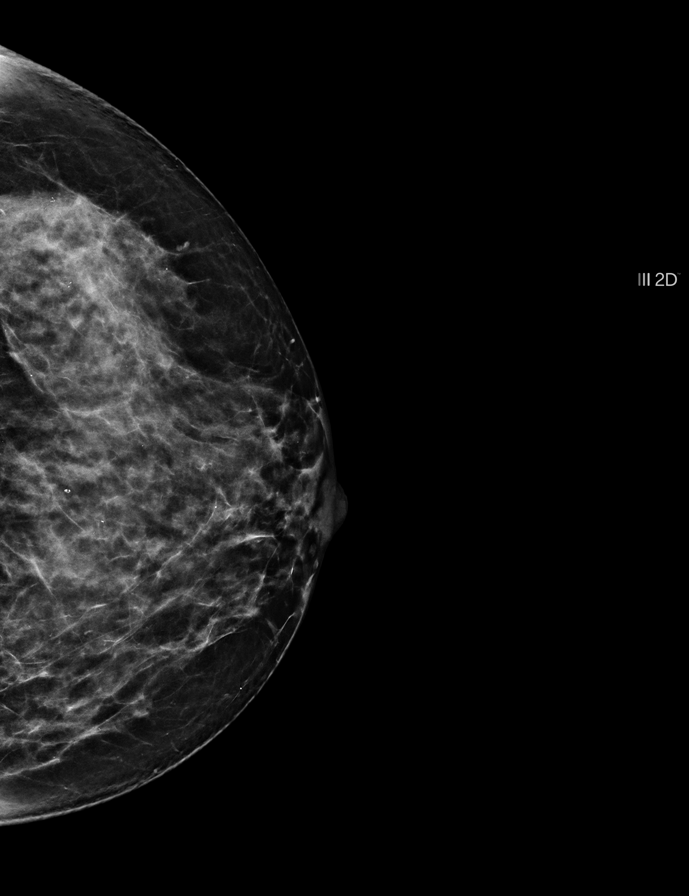

[R CC]
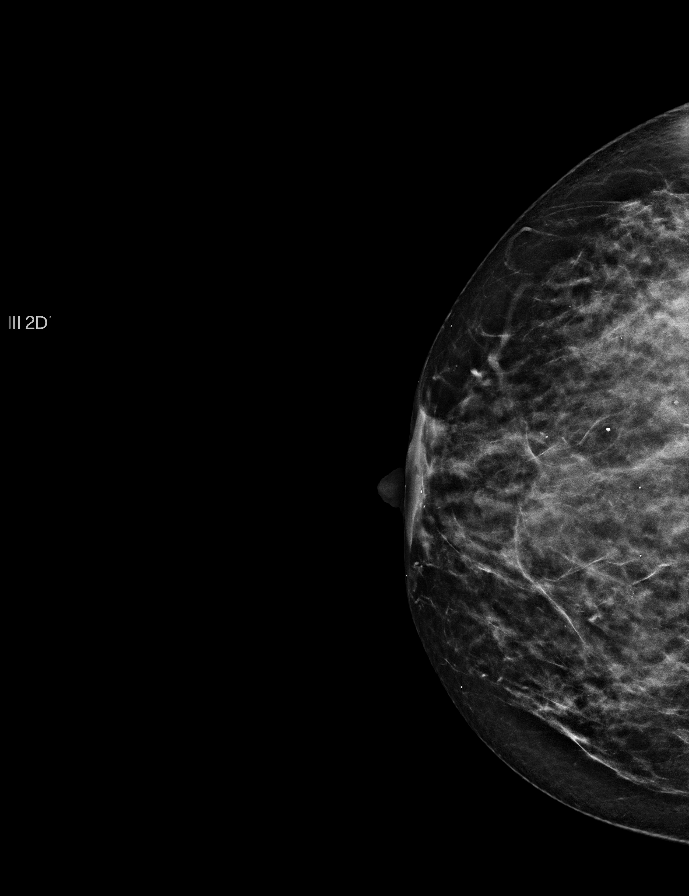

[R MLO]
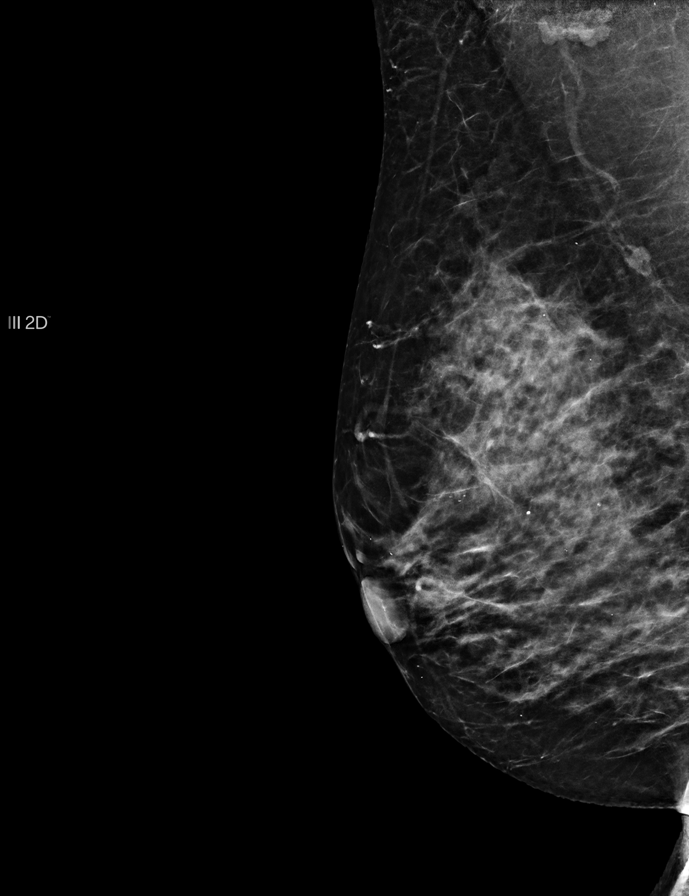

[L MLO tomo · 2 of 64 frames shown]
[frame 21/64]
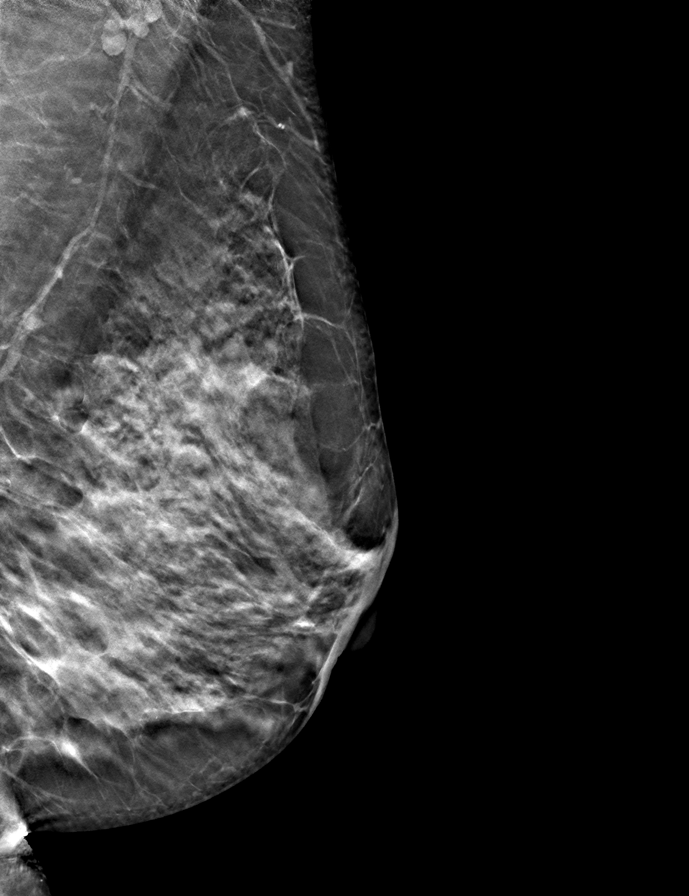
[frame 33/64]
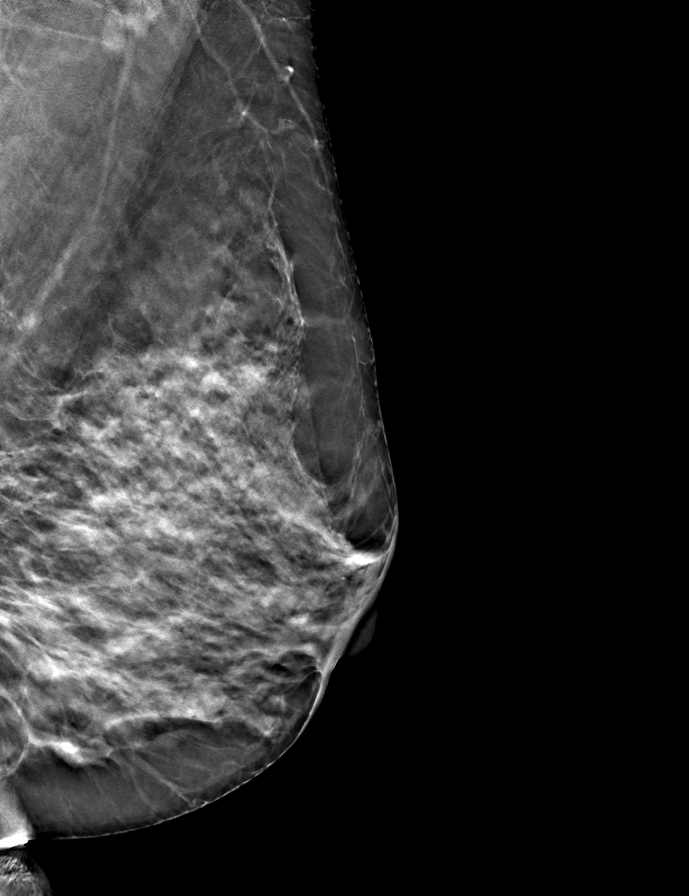

[R CC tomo · tomo slice 31/61.0]
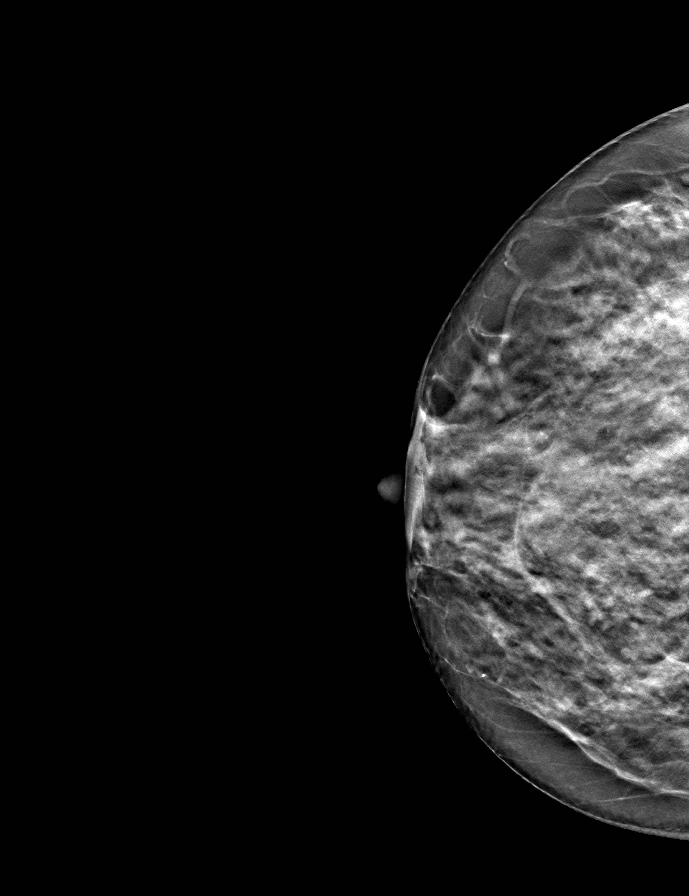

[L CC tomo · tomo slice 34/67.0]
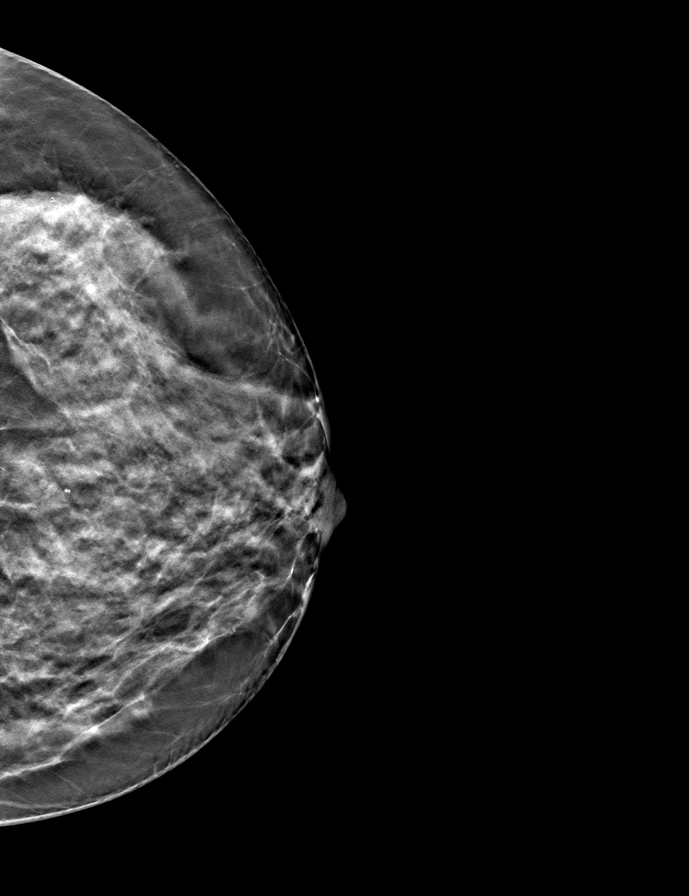

[R MLO tomo · tomo slice 32/63.0]
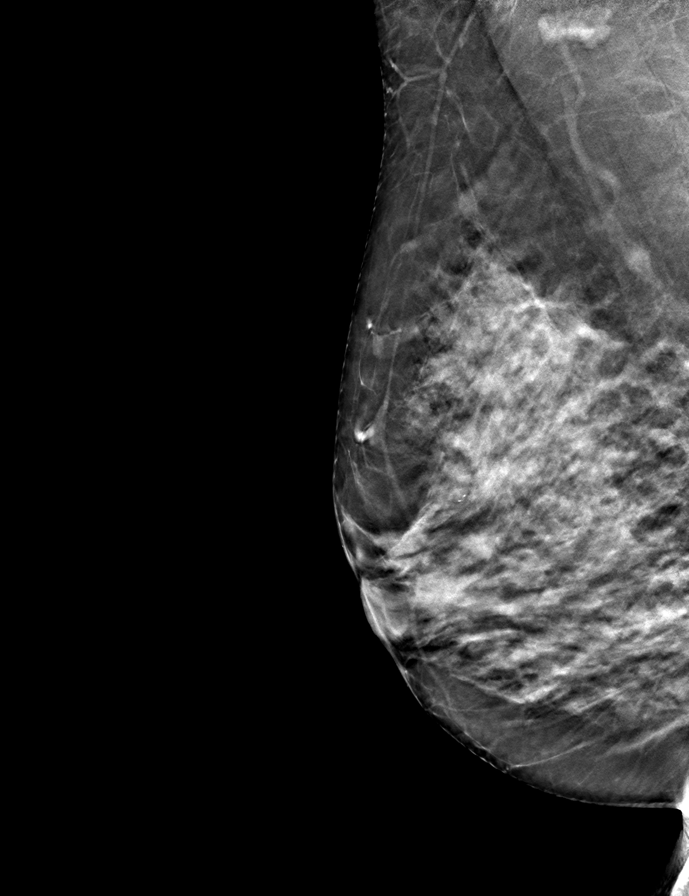

[9 of 24 positions shown; findings below may reference images not displayed]

FINDINGS: No suspicious mass, calcifications, or area of architectural 
distortion in either breast. Overall stable mammographic appearance.
IMPRESSION: No mammographic findings suggestive for malignancy. 
(BI-RADS 2) Benign findings. Routine mammographic follow-up is recommended.

## 2021-07-25 ENCOUNTER — Encounter: Admit: 2021-07-25 | Payer: PRIVATE HEALTH INSURANCE | Attending: Vascular and Interventional Radiology

## 2021-10-04 ENCOUNTER — Encounter: Admit: 2021-10-04 | Payer: PRIVATE HEALTH INSURANCE | Attending: Vascular and Interventional Radiology

## 2021-11-07 ENCOUNTER — Ambulatory Visit: Admit: 2021-11-07 | Payer: PRIVATE HEALTH INSURANCE | Attending: Dermatology

## 2021-11-07 ENCOUNTER — Inpatient Hospital Stay: Admit: 2021-11-07 | Discharge: 2021-11-07 | Payer: PRIVATE HEALTH INSURANCE

## 2021-11-07 DIAGNOSIS — C84A0 Cutaneous T-cell lymphoma, unspecified, unspecified site: Secondary | ICD-10-CM

## 2021-11-07 MED ORDER — CLONAZEPAM 0.5 MG TABLET
0.5 mg | Freq: Every day | ORAL | Status: AC | PRN
Start: 2021-11-07 — End: ?

## 2021-11-07 MED ORDER — ATORVASTATIN 10 MG TABLET
10 mg | ORAL | Status: AC
Start: 2021-11-07 — End: ?

## 2021-11-07 MED ORDER — DESVENLAFAXINE ER 100 MG TABLET,EXTENDED RELEASE 24 HR
100 mg | Freq: Every day | ORAL | Status: AC
Start: 2021-11-07 — End: ?

## 2021-11-07 MED ORDER — TROSPIUM 20 MG TABLET
20 mg | Freq: Two times a day (BID) | ORAL | Status: AC
Start: 2021-11-07 — End: ?

## 2021-11-07 MED ORDER — TRIAMCINOLONE ACETONIDE 0.1 % TOPICAL CREAM
0.1 % | Freq: Every day | TOPICAL | 4 refills | Status: AC
Start: 2021-11-07 — End: ?

## 2021-11-07 MED ORDER — VALCHLOR 0.016 % TOPICAL GEL
0.016 % | TRANSDERMAL | Status: AC
Start: 2021-11-07 — End: 2022-02-11

## 2021-11-07 MED ORDER — MELATONIN 5 MG CAPSULE
5 mg | Freq: Every day | ORAL | Status: AC
Start: 2021-11-07 — End: ?

## 2021-11-07 MED ORDER — LOSARTAN 25 MG TABLET
25 mg | Freq: Every day | ORAL | Status: AC
Start: 2021-11-07 — End: ?

## 2021-11-07 MED ORDER — TRETINOIN 0.1 % TOPICAL CREAM
0.1 % | TOPICAL | Status: AC | PRN
Start: 2021-11-07 — End: ?

## 2021-11-07 MED ORDER — LEVOTHYROXINE 100 MCG TABLET
100 MCG | ORAL | Status: AC
Start: 2021-11-07 — End: ?

## 2021-11-08 LAB — COMPREHENSIVE METABOLIC PANEL
BKR A/G RATIO: 2.8 — ABNORMAL HIGH (ref 1.0–2.2)
BKR ALANINE AMINOTRANSFERASE (ALT): 17 U/L (ref 10–35)
BKR ALBUMIN: 5.1 g/dL — ABNORMAL HIGH (ref 3.6–4.9)
BKR ALKALINE PHOSPHATASE: 64 U/L (ref 9–122)
BKR ANION GAP: 13 (ref 7–17)
BKR ASPARTATE AMINOTRANSFERASE (AST): 23 U/L (ref 10–35)
BKR AST/ALT RATIO: 1.4
BKR BILIRUBIN TOTAL: 0.4 mg/dL (ref <=1.2)
BKR BLOOD UREA NITROGEN: 14 mg/dL (ref 6–20)
BKR BUN / CREAT RATIO: 18.7 (ref 8.0–23.0)
BKR CALCIUM: 10.1 mg/dL (ref 8.8–10.2)
BKR CHLORIDE: 102 mmol/L (ref 98–107)
BKR CO2: 27 mmol/L (ref 20–30)
BKR CREATININE: 0.75 mg/dL (ref 0.40–1.30)
BKR EGFR, CREATININE (CKD-EPI 2021): >60 mL/min/{1.73_m2} (ref >=60)
BKR GLOBULIN: 1.8 g/dL — ABNORMAL LOW (ref 2.3–3.5)
BKR GLUCOSE: 74 mg/dL (ref 70–100)
BKR POTASSIUM: 4.4 mmol/L — ABNORMAL HIGH (ref 3.3–5.3)
BKR PROTEIN TOTAL: 6.9 g/dL (ref 6.6–8.7)
BKR SODIUM: 142 mmol/L (ref 136–144)
BKR WAM BASOPHILS: 0.4 mg/dL (ref 0.0–<=1.2)
BKR WAM EOSINOPHIL ABSOLUTE COUNT.: 1.4 x 1000/??L (ref 0.00–1.00)
BKR WAM LYMPHOCYTES: 18.7 % (ref 8.0–23.0)

## 2021-11-08 LAB — CBC WITH AUTO DIFFERENTIAL
BKR WAM ABSOLUTE IMMATURE GRANULOCYTES.: 0.04 x 1000/??L (ref 0.00–0.30)
BKR WAM ABSOLUTE LYMPHOCYTE COUNT.: 3.34 x 1000/??L (ref 0.60–3.70)
BKR WAM ABSOLUTE NRBC (2 DEC): 0 x 1000/??L (ref 0.00–1.00)
BKR WAM ANALYZER ANC: 4.14 x 1000/??L (ref 2.00–7.60)
BKR WAM BASOPHIL ABSOLUTE COUNT.: 0.04 x 1000/??L (ref 0.00–1.00)
BKR WAM EOSINOPHILS: 1.3 % (ref 0.0–5.0)
BKR WAM HEMATOCRIT (2 DEC): 44.9 % (ref 35.00–45.00)
BKR WAM HEMOGLOBIN: 14.1 g/dL (ref 11.7–15.5)
BKR WAM IMMATURE GRANULOCYTES: 0.5 % (ref 0.0–1.0)
BKR WAM MCH (PG): 28.7 pg (ref 27.0–33.0)
BKR WAM MCHC: 31.4 g/dL — ABNORMAL LOW (ref 31.0–36.0)
BKR WAM MCV: 91.3 fL — ABNORMAL LOW (ref 80.0–100.0)
BKR WAM MONOCYTE ABSOLUTE COUNT.: 0.61 x 1000/??L — ABNORMAL HIGH (ref 0.00–1.00)
BKR WAM MONOCYTES: 7.4 % (ref 4.0–12.0)
BKR WAM MPV: 9.8 fL (ref 8.0–12.0)
BKR WAM NEUTROPHILS: 50 % (ref 39.0–72.0)
BKR WAM NUCLEATED RED BLOOD CELLS: 0 % (ref 0.0–1.0)
BKR WAM PLATELETS: 233 x1000/??L (ref 150–420)
BKR WAM RDW-CV: 13.3 % (ref 11.0–15.0)
BKR WAM RED BLOOD CELL COUNT.: 4.92 M/??L (ref 4.00–6.00)
BKR WAM WHITE BLOOD CELL COUNT: 8.3 x1000/??L (ref 4.0–11.0)

## 2021-11-09 LAB — B/T SCREEN, BLOOD
BKR CD19+ ABSOLUTE COUNT: 554.4 /uL — ABNORMAL HIGH (ref 101–525)
BKR CD19+: 16.8 % (ref 5.13–21.20)
BKR CD19+CD10+ ABSOLUTE COUNT: 0 /uL
BKR CD19+CD10+: 0 %
BKR CD19+CD23+ ABS: 9.9 /uL
BKR CD19+CD23+: 0.3 %
BKR CD19+CD5+ ABSOLUTE COUNT: 85.8 /uL
BKR CD19+CD5+: 2.6 %
BKR CD26-CD4+: 13 %
BKR CD3+ ABS CNT: 1976.7 /uL (ref 725–2432)
BKR CD3+: 59.9 % (ref 58.50–84.57)
BKR CD3+CD4+ ABSOLUTE COUNT: 1452 /uL (ref 466–1608)
BKR CD3+CD4+: 44 % (ref 29.33–59.86)
BKR CD3+CD8+ ABS CNT: 521.4 /uL (ref 153–980)
BKR CD3+CD8+: 15.8 % (ref 9.92–38.76)
BKR CD3-CD16/56+ ABS CNT: 752.4 /uL — ABNORMAL HIGH (ref 90–620)
BKR CD3-CD16/56+: 22.8 % (ref 3.87–27.52)
BKR CD4+CD7-: 8.4 %
BKR CD4/CD8 RATIO: 2.78 % (ref 0.73–5.86)

## 2021-11-09 LAB — FLOW CYTOMETRY - LEUKEMIA/LYMPHOMA/NEOPLASIA, BLOOD

## 2021-11-09 LAB — LYMPH 2 PANEL
BKR B-CELL MARKERS - LYMPH GATE: 44 % (ref 29.33–59.86)
BKR CD103+CD11C+: 0.1 %
BKR CD19+CD103+: 0.1 %
BKR CD19+CD11C+: 2.1 %
BKR CD19+CD38+: 0.6 %
BKR CD19+CD43+: 2 % — ABNORMAL HIGH (ref 90–620)
BKR CD19+FMC7+: 12.1 % (ref 466–1608)
BKR CD19+IGG+: 0.2 % (ref 5.13–21.20)
BKR CD19+IGM+: 9.1 % — ABNORMAL HIGH (ref 101–525)
BKR CD5+CD43+: 48 %
BKR T-CELL MARKERS - LYMPH GATE: 2.6 %

## 2021-11-11 ENCOUNTER — Encounter: Admit: 2021-11-11 | Payer: PRIVATE HEALTH INSURANCE

## 2021-11-11 ENCOUNTER — Encounter: Admit: 2021-11-11 | Payer: PRIVATE HEALTH INSURANCE | Attending: Dermatology

## 2021-11-11 ENCOUNTER — Telehealth: Admit: 2021-11-11 | Payer: PRIVATE HEALTH INSURANCE

## 2021-11-11 DIAGNOSIS — D479 Neoplasm of uncertain behavior of lymphoid, hematopoietic and related tissue, unspecified: Secondary | ICD-10-CM

## 2021-11-13 ENCOUNTER — Encounter: Admit: 2021-11-13 | Payer: PRIVATE HEALTH INSURANCE

## 2021-11-13 NOTE — Progress Notes
[facility]  - Dr. Aviva Kluver Patient Evaluation History and Physical ExamDate of Encounter: 6/1/2023Anne MartocciMRN#: [id number] DOB: [birth year]Subjective: Chief Complaint:   Melissa Harper comes in today for evaluation of her cutaneous condition.Chief Complaint Patient presents with ? 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 She states she first noticed erythema on her torso in 2017.  Biopsies have shown CTCL. She states that blood work done in 2022 showed an abnormal population of B cells (? 15% involvement) - she will get [place] the report.  She states that no work up was done for this abnormal value.    Current treatment Valchlor 0.0016% gel biw, tacrolimus ointment and triamcinolone 0.1% crm.She reports a family history of lymphoma, type unknown (mother).  Medical History: PMH PSH  She    has no past medical history on file. She  has no past surgical history on file. Social History Family History She  has no history on file for tobacco use, alcohol use, and drug use. No family history on file. Medications Current Outpatient Medications Medication Sig Dispense Refill ? atorvastatin (LIPITOR) 10 mg tablet Take 1 tablet (10 mg total) by mouth every 24 hours.   ? clonazePAM (KLONOPIN) 0.5 mg tablet Take 1 tablet (0.5 mg total) by mouth daily as needed.   ? desvenlafaxine 100 mg Tb24 Take 100 mg by mouth daily.   ? losartan (COZAAR) 25 mg tablet Take 1 tablet (25 mg total) by mouth daily.   ? trospium (SANCTURA) 20 mg tablet Take 1 tablet (20 mg total) by mouth 2 (two) times daily (0800, 1800).   ? levothyroxine (SYNTHROID) 100 MCG tablet Take 1 tablet (100 mcg total) by mouth every 24 hours.   ? melatonin 5 mg Cap Take 10 mg by mouth daily.   ? tretinoin (RETIN-A) 0.1 % cream Apply 1 Application topically as needed. ? VALCHLOR 0.016 % Gel Place 1 Application onto the skin twice a week.   No current facility-administered medications for this visit.  Allergies She is allergic to lisinopril. Review of Systems: The following were asked of the patient: unintentional weight loss, fever, chest pain, cough, shortness of breath, nausea, vomiting, abdominal pain, lumps, bumps, headaches, weakness, or enlarging lymph node. All were negative. Physical Exam: Skin: Pertinent physical findings were noted as recorded on the attached body diagram.    Patient exam or treatment required medical chaperone.The sensitive parts of the examination were performed with chaperone present: [name] R. 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  ADDENDUM - RESULTS and IMPRESSION/PLAN:RESULTS - Labs - November 07, 2021:Component    Latest Ref Rng 11/07/2021 Source Blood  Source Blood  CD3+    58.50 - 84.57 % 59.90  CD3+ Abs Cnt    725 - 2,432 /uL 1,976.7  CD3+CD4+    29.33 - 59.86 % 44.00  CD3+CD4+ Abs Cnt    466 - 1,608 /uL 1,452  CD3+CD8+    9.92 - 38.76 % 15.80  CD3+CD8+ Abs Cnt    153 - 980 /uL 521.4  CD4/CD8 Ratio    0.73 - 5.86  2.78  CD3-CD16/56+    3.87 - 27.52 %  22.80  CD3-CD16/56+ Abs Cnt    90 - 620 /uL 752.4 (H)  CD19+    5.13 - 21.20 % 16.80  CD19+ Absolute Count    101 - 525 /uL 554.4 (H)  CD19+CD5+    % 2.6  CD19+CD5+ Abs Cnt    /uL 85.8  CD19+CD10+    % 0.0  CD19+CD10+ Abs Cnt    /uL 0  CD19+CD23+    % 0.3  CD19+CD23+ Abs Cnt    /ul 9.9  CD4+CD7-    % 8.4  CD26-CD4+    % 13.0  CD19KAPPA Marker Performed  CD19LAMBDA Marker Performed  CD2 Marker Performed  CD20 Marker Performed  CD25 Marker Performed  WBC    4.0 - 11.0 x1000/?L 8.3  RBC    4.00 - 6.00 M/?L 4.92  Hemoglobin    11.7 - 15.5 g/dL 11.9  Hematocrit    14.78 - 45.00 % 44.90  MCV    80.0 - 100.0 fL 91.3  MCH    27.0 - 33.0 pg 28.7  MCHC    31.0 - 36.0 g/dL 29.5  RDW-CV    62.1 - 15.0 % 13.3  Platelets    150 - 420 x1000/?L 233  MPV    8.0 - 12.0 fL 9.8  Neutrophils    39.0 - 72.0 % 50.0  Lymphocytes    17.0 - 50.0 % 40.3  Monocytes    4.0 - 12.0 % 7.4  Eosinophils    0.0 - 5.0 % 1.3  Basophils    0.0 - 1.4 % 0.5  Immature Granulocytes    0.0 - 1.0 % 0.5  nRBC    0.0 - 1.0 % 0.0  ANC (Abs Neutrophil Count)    2.00 - 7.60 x 1000/?L 4.14  Absolute Lymphocyte Count    0.60 - 3.70 x 1000/?L 3.34  Monocytes (Absolute)    0.00 - 1.00 x 1000/?L 0.61  Eosinophil Absolute Count    0.00 - 1.00 x 1000/?L 0.11  Basophils Absolute    0.00 - 1.00 x 1000/?L 0.04  Immature Granulocytes (Abs)    0.00 - 0.30 x 1000/?L 0.04  nRBC Absolute    0.00 - 1.00 x 1000/?L 0.00  Sodium    136 - 144 mmol/L 142  Potassium    3.3 - 5.3 mmol/L 4.4  Chloride    98 - 107 mmol/L 102  CO2    20 - 30 mmol/L 27  Anion Gap    7 - 17  13  Glucose    70 - 100 mg/dL 74  BUN    6 - 20 mg/dL 14  Creatinine    3.08 - 1.30 mg/dL 6.57  Calcium    8.8 - 10.2 mg/dL 84.6  BUN/Creatinine Ratio    8.0 - 23.0  18.7  Total Protein    6.6 - 8.7 g/dL 6.9  Albumin    3.6 - 4.9 g/dL 5.1 (H)  Total Bilirubin    <=1.2 mg/dL 0.4  Alkaline Phosphatase    9 - 122 U/L 64  Alanine Aminotransferase (ALT)    10 - 35 U/L 17  Aspartate Aminotransferase (AST)    10 - 35 U/L 23  Globulin    2.3 - 3.5 g/dL 1.8 (L)  A/G Ratio    1.0 - 2.2  2.8 (H)  AST/ALT Ratio    See Comment  1.4  eGFR (Creatinine)    >=60 mL/min/1.16m2 >60 CD19+FMC7+    % 12.1  CD19+CD103+    %  0.1  CD19+CD38+    % 0.6  CD19+CD11C+    % 2.1  CD103+CD11C+    % 0.1  CD19+CD43+    % 2.0  CD19+IGG+    % 0.2  CD19+IGM+    % 9.1  CD5+CD43+    % 48.0  LLNBL Abnormal Flag    (none)  See Interpretation !  BTBLD Tubes T1?  LLNBL Interpretation Involvement of blood [7-8% of cells] with monoclonal B cell lymphoproliferative disease. The immunophenotype (CD19+ CD20+ CD5- CD10- FMC7+ IGM+ KAPPA+ CD11c- CD103-) is not specific for any single NHL subtype but is not consistent with most cases of chronic lymphocytic leukemia, mantle cell lymphoma or hairy cell leukemia.?  LLNBL Electronic Signature This report is electronically signed by: Isac Sarna, MD on 11/09/21 at 11:34 AM ?  LLNBL Disclaimer This report may include tests which were developed and performance characteristics determined by [facility] using analyte specific reagents. It has not been cleared or approved by the U.S. Food and Drug Administration. The FDA has determined that such clearance or approval is not necessary. This report is used for clinical purposes. It should not be reported as investigational or for research. This laboratory is certified under the Clinical Laboratory Improvement Amendments of 1988 (CLIA) as qualified to perform high complexity clinical laboratory testing.   (H) High(L) Low! 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  She will discontinue tacrolimus ointment, as this is contraindicated CTCL.  She will resume triamcinolone 0.1% cream biw.  There was an abnormal population of non-specific monoclonal B cells detected on flow cytometry. Due to patient personal history of abnormal B cells in blood and family history of lymphoma, she has been referred to Dr. Filbert Schilder for further evaluation and work-up, if necessary.   She will return for follow-up at our office in 4 months or sooner if worsening disease.

## 2021-11-14 ENCOUNTER — Encounter: Admit: 2021-11-14 | Payer: PRIVATE HEALTH INSURANCE

## 2021-11-14 ENCOUNTER — Telehealth: Admit: 2021-11-14 | Payer: PRIVATE HEALTH INSURANCE

## 2021-11-14 DIAGNOSIS — I10 Essential (primary) hypertension: Secondary | ICD-10-CM

## 2021-11-14 DIAGNOSIS — E7800 Pure hypercholesterolemia, unspecified: Secondary | ICD-10-CM

## 2021-11-14 DIAGNOSIS — F32A Depression: Secondary | ICD-10-CM

## 2021-11-14 NOTE — Telephone Encounter
DX:  B Cell Lymphoproliferative DiseaseReferring Provider:  Dr. Marin Comment call made to patient prior to oncology consult appointment that is being scheduled.  I introduced myself and explained my role, the reason for my call and what to expect at the first visit.  Reviewed directions and parking instructions.  Allergy information reviewed and updated and requested that patient bring an updated medication list with her to her appointment.  Office contact information provided and encouraged to call with any questions or concerns.  Initial nursing assessment initiated.Patient has no hx of falls and uses no assistive devices when ambulating so is not a fall risk at this time.Briefly, Melissa [facility] is a 61 year old married woman who lives with her husband in [place].  She has no children.  She is retired but worked at [employer] and denies any exposure to chemicals or radiation.  She has no hx of [employer].  She quit smoking cigarettes in the late 1990's.  Prior to that she smoked 1/2 PPD for 15 years.  She smoked marijuana in the past and recently had 1/2 of a marijuana gummy.  She drinks 1 glass of wine daily.Patient has a hx of depression.  She takes medication for this.  She saw a therapist in the past but does not feel the need for one now.  She denies ever having suicidal thoughts/ideation.COVID-19 screening is negative.  She has completed her COVID-19 vaccine series.  She has not had COVID-19.

## 2021-11-20 ENCOUNTER — Encounter: Admit: 2021-11-20 | Payer: PRIVATE HEALTH INSURANCE | Attending: Hematology

## 2021-11-20 ENCOUNTER — Inpatient Hospital Stay: Admit: 2021-11-20 | Discharge: 2021-11-20 | Payer: PRIVATE HEALTH INSURANCE

## 2021-11-25 ENCOUNTER — Encounter: Admit: 2021-11-25 | Payer: PRIVATE HEALTH INSURANCE | Attending: Hematology

## 2021-11-29 ENCOUNTER — Encounter: Admit: 2021-11-29 | Payer: PRIVATE HEALTH INSURANCE | Attending: Hematology

## 2021-11-29 ENCOUNTER — Inpatient Hospital Stay: Admit: 2021-11-29 | Discharge: 2021-11-29 | Payer: PRIVATE HEALTH INSURANCE

## 2021-11-29 ENCOUNTER — Ambulatory Visit: Admit: 2021-11-29 | Payer: PRIVATE HEALTH INSURANCE | Attending: Vascular and Interventional Radiology

## 2021-12-04 ENCOUNTER — Inpatient Hospital Stay: Admit: 2021-12-04 | Discharge: 2021-12-04 | Payer: PRIVATE HEALTH INSURANCE

## 2021-12-04 ENCOUNTER — Ambulatory Visit: Admit: 2021-12-04 | Payer: PRIVATE HEALTH INSURANCE | Attending: Hematology

## 2021-12-04 DIAGNOSIS — D72820 Lymphocytosis (symptomatic): Secondary | ICD-10-CM

## 2021-12-04 DIAGNOSIS — D479 Neoplasm of uncertain behavior of lymphoid, hematopoietic and related tissue, unspecified: Secondary | ICD-10-CM

## 2021-12-04 LAB — HEPATITIS B SURFACE ANTIGEN     (BH GH L LMW YH): BKR HEPATITIS B SURFACE ANTIGEN: NEGATIVE g/dL — ABNORMAL LOW (ref 0.70–1.50)

## 2021-12-04 LAB — TOTAL PROTEIN AND ALBUMIN,SERUM  (YH)
BKR ALBUMIN, SERUM: 4.9 g/dL (ref 3.6–4.9)
BKR TOTAL PROTEIN, SERUM: 6.8 g/dL (ref 6.0–8.3)

## 2021-12-04 LAB — LACTATE DEHYDROGENASE: BKR LACTATE DEHYDROGENASE: 159 U/L (ref 122–241)

## 2021-12-04 LAB — HEPATITIS B CORE ANTIBODY, TOTAL: BKR HEPATITIS B CORE TOTAL ANTIBODY: NEGATIVE

## 2021-12-04 LAB — HEPATITIS C AB WITH REFLEX TO HCV PCR: BKR HEPATITIS C ANTIBODY: NEGATIVE

## 2021-12-04 NOTE — Progress Notes
Consultation Patient EvaluationJune 28, 2023NAMEPyper Olexa  MRN: [id number] DOB: 30-Oct-1962AGE: 61 y.o.                                              REFERRING PHYSICIAN: Dr. Lisette Grinder, [name], PA[address] [place],  [place] [id number] COMPLAINT: B cell lymphocytosisHPI:Ashwika Gohr is a 61 y.o. 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 The patient has had a lacy rash since 2017 and had multiple biopsies that were non diagnostic. She eventually had a repeat biopsy in 2021 where an abnormal T cell population was seen on IHC and TCR rearrangement studies suggested clonality. She established care at [facility] and had staging PET/[place] that was unrevealing. Her rash is not bothersome and she has found good relief with topical therapies. Earlier in 2023 she moved back to [place] from [place] due to her husbands health concerns and desires to be closer to family. She established CTCL follow up with Dr. Berdie Ogren and flow cytometry of her peripheral blood identified a small CD5-/CD10- B cell clone. She presents today for further evaluation. On interview Ms. Harner describes feeling well overall. After being notified of the clonal B cell on flow, she re-reviewed her [facility] records and actually noticed this clone was identified at similar % back in January 2021. She has remained clinical well. No clear B symptoms, but does have chronic non drenching night sweats/flushing. No adenopathy. Her PS is 0 and she presents with her husband. AOZ:HYQMVHQI focused review of systems is otherwise negative.PAST MEDICAL HISTORY:Past Medical History: Diagnosis Date ? Depression  ? Hypercholesteremia  ? Hypertension  Autoimmune thyroditis MEDICATIONS:Outpatient Encounter Medications as of 12/04/2021 Medication Sig Dispense Refill ? atorvastatin (LIPITOR) 10 mg tablet Take 1 tablet (10 mg total) by mouth every 24 hours.   ? clonazePAM (KLONOPIN) 0.5 mg tablet Take 1 tablet (0.5 mg total) by mouth daily as needed.   ? desvenlafaxine 100 mg Tb24 Take 100 mg by mouth daily.   ? levothyroxine (SYNTHROID) 100 MCG tablet Take 1 tablet (100 mcg total) by mouth every 24 hours.   ? losartan (COZAAR) 25 mg tablet Take 1 tablet (25 mg total) by mouth daily.   ? melatonin 5 mg Cap Take 10 mg by mouth daily.   ? tretinoin (RETIN-A) 0.1 % cream Apply 1 Application topically as needed.   ? triamcinolone (KENALOG) 0.1 % cream Apply topically daily. Apply to affected area 454 g 3 ? trospium (SANCTURA) 20 mg tablet Take 1 tablet (20 mg total) by mouth 2 (two) times daily (0800, 1800).   ? VALCHLOR 0.016 % Gel Place 1 Application onto the skin twice a week.   No facility-administered encounter medications on file as of 12/04/2021. ALLERGIES:LisinoprilSOCIAL HISTORY:Social History Socioeconomic History ? Marital status: Married Tobacco Use ? Smoking status: Former   Packs/day: 0.50   Years: 15.00   Pack years: 7.50   Types: Cigarettes   Quit date: 1997   Years since quitting: 26.5 ? Smokeless tobacco: Never Vaping Use ? Vaping Use: Never used Substance and Sexual Activity ? Alcohol use: Yes   Alcohol/week: 7.0 standard drinks   Types: 7 Glasses of wine per week ? Drug use: Not Currently   Types: Marijuana   Comment: smoke or gummies Retired from [employer], previously lived in [place], recently moved back to  [place] (husband with early Alzheimer) to be closer to family (step daughter, sisters). Currently building a home in [place]. FAMILY HISTORY:Family History Problem Relation Age of Onset ? Lymphoma Mother  ? Breast cancer Mother  ? Diabetes Father       died from an insulin overdose ? No Known Problems Sister  ? No Known Problems Brother  ? Diabetes Brother  ? Heart attack Brother  No autoimmune disease in her familyDLBCL and breast cancer in her mother age 72PHYSICAL EXAMINATION:BP (!) 133/93  - Pulse (!) 94  - Temp 98.6 ?F (37 ?C)  - Resp 18  - Ht 5' 8 (1.727 m)  - Wt 64.7 kg  - SpO2 96%  - BMI 21.69 kg/m? Wt Readings from Last 3 Encounters: 12/04/21 64.7 kg Appearance Well appearing female, comfortable on room air, no acute distress Lymphatics no cervical, supraclavicular, axillary, or inguinal adenopathy Skin Warm and dry, no acute lesions. Very faint lacy rash on torso.  Eyes Sclera anicteric, pupils reactive to light, movements in tact ENT Clear OP without thrush, patent nares, no sinus tenderness Chest Moving air well, no wheeze/rale/rhonchi Heart Regular, rate 80, no murmur, rub or gallop Abdomen Soft, nontender, active bowel sounds, no splenomegaly Neuro Face symmetric, normal gait, non focal exam Musculoskeletal No joint swelling, erythema or warmth Extremities Warm and well perfused, no edema Psychiatric normal affect, bright mood, good insight and judgement LABORATORY: I have reviewed the labs and have summarized them below for our records. Lab Results Component Value Date  WBC 8.3 11/07/2021  RBC 4.92 11/07/2021  HGB 14.1 11/07/2021  HCT 44.90 11/07/2021  MCV 91.3 11/07/2021  MCH 28.7 11/07/2021  MCHC 31.4 11/07/2021  PLT 233 11/07/2021  MPV 9.8 11/07/2021 Lab Results Component Value Date  CO2 27 11/07/2021  BUN 14 11/07/2021  CREATININE 0.75 11/07/2021  GLU 74 11/07/2021 SPEP/IFE/light chains along with hepatitis panel is pendingPATHOLOGY DATA: Pathology reviewed and is summarized below for our records. 11/07/2021 - peripheral blood flow cytometryInvolvement of blood [7-8% of cells] with monoclonal B cell lymphoproliferative disease. The immunophenotype (CD19+ CD20+ CD5- CD10- FMC7+ IGM+ KAPPA+ CD11c- CD103-) is not specific for any single NHL subtype but is not consistent with most cases of chronic lymphocytic leukemia, mantle cell lymphoma or hairy cell leukemia. Essentially normal lymphoid immunophenotype with no phenotypically unique cells suggestive of T-cell lymphoproliferative disorder. RADIOLOGY REVIEW: Images reviewed and are summarized below for our records. 07/03/20 - PET/[place] skull base to mid thighI reviewed the scan. She has diffuse thyroid uptake (known thyroditis). Otherwise no abnormal FDG uptake5/8/23 - [place] A/P1. ?Diverticulosis of the sigmoid and descending colon with no evidence of diverticulitis. 2. ?Status post right nephrectomy and hysterectomy. 3. No splenomegaly or adenopathyIMPRESSION(S):Lizzette Lundberg is a 61 y.o. 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 Her blood work and recent imaging (PET/[place] Jan 2022 and [place] A/P 10/2021) was unrevealing and she is unlikely to have a B cell lymphoma at this tme. Much of the visit was used to discuss the diagnosis and natural history of monoclonal B cell lymphocytosis. This is an increasingly recognized finding due to increased utilization of high sensitivity flow cytometry. A recent study from [facility] screened over 10,000 individuals over age 36 and found clonal B cells (MBL) in 17% of individuals - with increased prevelence by age (16% in those age 32-69, 24% in those age 36-79 years, 28% in 80-89, and 42% in >90  years). They observed similar survival among individuals with and without MBL, yet individuals with MBL did have a fourfold increased risk of lymphoid malignancies. [Slager et al. Blood (2022) 140 (15): [id number].]We reviewed her prior work up from [facility] form January 2022 and it shows she had peripheral blood involvement at that time by the identical CD5/CD10- B cell clone. We discussed that finding an MBL is quite common and likely of greater prevalence in individuals with autoimmune conditions. We discussed that individuals with MBL (and autoimmune conditions) do have higher risk of future lymphoma, but that the overall incidence remains quite low. Ultimately, Ms Mcquown should have occasional surveillance with blood work for her MBL as outlined below. PLAN(S):1. I will follow up pending labs (SPEP/IFE/light chains and hepatitis screen). I plan to see her back in 12 months for MBL follow up. She can have labs (CBC) with her PCP or Dr. Tammi Sou in about 6 months time. 2. Appreciate ongoing f/u with dermatology for her CTCL3. Encouraged aged appropriate cancer screening. Mammogram was Feb 2023, colonoscopy Nov 2022 (q5years due to history of polyps). 4. We reviewed COVID pandemic in detail, including breakthrough infections and benefit of early of COVID antivirals/antibodies (when available) in event of illness. Ms. Charters asked good questions and knows to call in the interim with questions or concerns. I spent 60  minutes in direct contact with patient and spouse, of which at least half was spent in counseling and coordination of care.Sincerely, Electronically Signed by Filbert Schilder, MDAttending Physician, Hematology OncologyCC:Carlson, Rayann Heman [address] [place],  [place] [address]

## 2021-12-05 LAB — IMMUNOFIXATION ELECTROPHORESIS, SERUM

## 2021-12-05 LAB — FREE KAPPA LAMBDA WITH RATIO, SERUM
BKR KAPPA FLC, SERUM: 1.53 mg/dL (ref 0.33–1.94)
BKR KAPPA/LAMBDA FLC RATIO, SERUM: 1.59 (ref 0.26–1.65)
BKR LAMBDA FLC, SERUM: 0.96 mg/dL (ref 0.57–2.63)

## 2021-12-06 LAB — PROTEIN ELECTROPHORESIS, SERUM    (YH)
BKR ALBUMIN ELP: 4.92 g/dL — ABNORMAL HIGH (ref 3.50–4.70)
BKR ALPHA-1-GLOBULIN: 0.14 g/dL (ref 0.10–0.30)
BKR ALPHA-2-GLOBULIN: 0.65 g/dL (ref 0.60–1.00)
BKR BETA GLOBULIN: 0.52 g/dL — ABNORMAL LOW (ref 0.70–1.20)
BKR GAMMA GLOBULIN: 0.56 g/dL — ABNORMAL LOW (ref 0.70–1.50)

## 2022-02-11 ENCOUNTER — Encounter: Admit: 2022-02-11 | Payer: PRIVATE HEALTH INSURANCE | Attending: Critical Care Medicine

## 2022-02-11 ENCOUNTER — Encounter: Admit: 2022-02-11 | Payer: PRIVATE HEALTH INSURANCE | Attending: Dermatology

## 2022-02-11 MED ORDER — VALCHLOR 0.016 % TOPICAL GEL
0.016 % | TRANSDERMAL | 3 refills | Status: AC
Start: 2022-02-11 — End: 2022-05-27

## 2022-03-20 ENCOUNTER — Telehealth: Admit: 2022-03-20 | Payer: PRIVATE HEALTH INSURANCE | Attending: Dermatology

## 2022-03-20 ENCOUNTER — Encounter: Admit: 2022-03-20 | Payer: PRIVATE HEALTH INSURANCE | Attending: Dermatology

## 2022-03-20 NOTE — Telephone Encounter
Pt had to cancel today's appt due to her not feeling well. Providers next available appt is in Jan but pt will be in [facility]. Please advise

## 2022-03-24 NOTE — Telephone Encounter
Called today to see if patient can come in today at 3 PM, requested she call clinic if she can make it.

## 2022-03-31 ENCOUNTER — Encounter: Admit: 2022-03-31 | Payer: PRIVATE HEALTH INSURANCE | Attending: Dermatology

## 2022-03-31 DIAGNOSIS — C84A0 Cutaneous T-cell lymphoma, unspecified, unspecified site: Secondary | ICD-10-CM

## 2022-04-01 NOTE — Progress Notes
CC: Melissa Harper is a 61 y.o. female being seen in follow-up for Chief Complaint Patient presents with ? Total Body Skin Exam HPI: Presents for TBSE. She has a history of CTCL.  She has been using Valchlor 0.016% gel biw, and triamcinolone 0.1% crm qwk to active areas.  She reports a family history of lymphoma, type unknown (mother).  ?ZOX:WRUE Medical History: Diagnosis Date ? Depression  ? Hypercholesteremia  ? Hypertension  Medications:Current Outpatient Medications Medication Sig Dispense Refill ? atorvastatin (LIPITOR) 10 mg tablet Take 1 tablet (10 mg total) by mouth every 24 hours.   ? clonazePAM (KLONOPIN) 0.5 mg tablet Take 1 tablet (0.5 mg total) by mouth daily as needed.   ? levothyroxine (SYNTHROID) 100 MCG tablet Take 1 tablet (100 mcg total) by mouth every 24 hours.   ? losartan (COZAAR) 25 mg tablet Take 1 tablet (25 mg total) by mouth daily.   ? melatonin 5 mg Cap Take 10 mg by mouth daily.   ? tretinoin (RETIN-A) 0.1 % cream Apply 1 Application  topically as needed.   ? triamcinolone (KENALOG) 0.1 % cream Apply topically daily. Apply to affected area 454 g 3 ? trospium (SANCTURA) 20 mg tablet Take 1 tablet (20 mg total) by mouth 2 (two) times daily (0800, 1800).   ? VALCHLOR 0.016 % Gel Place 1 Application  onto the skin twice a week. 60 g 2 ? desvenlafaxine 100 mg Tb24 Take 100 mg by mouth daily. (Patient not taking: Reported on 03/31/2022)   No current facility-administered medications for this visit. Review of Systems:The following were asked of the patient: unintentional weight loss, fever, chest pain, cough, shortness of breath, nausea, vomiting, abdominal pain, lumps, bumps, headaches, weakness, or enlarging lymph node. All were negative. Physical Exam:Skin examination was performed. Pertinent physical findings were noted as recorded on the attached body diagram. Patient exam or treatment required medical chaperone.The sensitive parts of the examination were performed with chaperone present: [name] R. 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

## 2022-05-09 ENCOUNTER — Encounter: Admit: 2022-05-09 | Payer: PRIVATE HEALTH INSURANCE | Attending: Dermatology

## 2022-05-26 ENCOUNTER — Encounter: Admit: 2022-05-26 | Payer: PRIVATE HEALTH INSURANCE | Attending: Dermatology

## 2022-05-27 MED ORDER — VALCHLOR 0.016 % TOPICAL GEL
0.016 % | 3 refills | Status: AC
Start: 2022-05-27 — End: ?

## 2022-09-29 ENCOUNTER — Encounter: Admit: 2022-09-29 | Payer: PRIVATE HEALTH INSURANCE | Attending: Dermatology

## 2022-09-29 DIAGNOSIS — C84A0 Cutaneous T-cell lymphoma, unspecified, unspecified site: Secondary | ICD-10-CM

## 2022-09-29 MED ORDER — MELOXICAM 7.5 MG TABLET
7.5 mg | Status: AC
Start: 2022-09-29 — End: ?

## 2022-09-29 NOTE — Progress Notes
CC: Melissa Harper is a 62 y.o. female being seen in follow-up for Chief Complaint Patient presents with  Total Body Skin Exam HPI: Presents for TBSE. She has a history of CTCL.  She has been using Valchlor 0.016% gel biw, and triamcinolone 0.1% crm qwk to active areas.   She reports a family history of lymphoma, type unknown (mother).  XLK:GMWN Medical History: Diagnosis Date  Depression   Hypercholesteremia   Hypertension  Medications:Current Outpatient Medications Medication Sig Dispense Refill  atorvastatin (LIPITOR) 10 mg tablet Take 1 tablet (10 mg total) by mouth every 24 hours.    clonazePAM (KLONOPIN) 0.5 mg tablet Take 1 tablet (0.5 mg total) by mouth daily as needed.    desvenlafaxine 100 mg Tb24 Take 100 mg by mouth daily. (Patient not taking: Reported on 03/31/2022)    levothyroxine (SYNTHROID) 100 MCG tablet Take 1 tablet (100 mcg total) by mouth every 24 hours.    losartan (COZAAR) 25 mg tablet Take 1 tablet (25 mg total) by mouth daily.    melatonin 5 mg Cap Take 10 mg by mouth daily.    meloxicam (MOBIC) 7.5 mg tablet take 1 tablet by mouth twice a day as needed for pain    tretinoin (RETIN-A) 0.1 % cream Apply 1 Application  topically as needed. (Patient not taking: Reported on 09/29/2022)    triamcinolone (KENALOG) 0.1 % cream Apply topically daily. Apply to affected area 454 g 3  trospium (SANCTURA) 20 mg tablet Take 1 tablet (20 mg total) by mouth 2 (two) times daily (0800, 1800).    VALCHLOR 0.016 % Gel APPLY A THIN FILM TO THE SKIN TWICE A WEEK 60 g 2 No current facility-administered medications for this visit. Review of Systems:The following were asked of the patient: unintentional weight loss, fever, chest pain, cough, shortness of breath, nausea, vomiting, abdominal pain, lumps, bumps, headaches, weakness, or enlarging lymph node. All were negative. Physical Exam:Skin examination was performed. Pertinent physical findings were noted as recorded on the attached body diagram. Patient exam or treatment required medical chaperone.The sensitive parts of the examination were performed with chaperone present: [name] R. 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

## 2022-10-18 ENCOUNTER — Encounter: Admit: 2022-10-18 | Payer: PRIVATE HEALTH INSURANCE | Attending: Dermatology

## 2022-10-20 MED ORDER — VALCHLOR 0.016 % TOPICAL GEL
0.016 % | Freq: Every day | TOPICAL | 3 refills | Status: AC
Start: 2022-10-20 — End: ?

## 2022-10-27 ENCOUNTER — Encounter: Admit: 2022-10-27 | Payer: PRIVATE HEALTH INSURANCE | Attending: Dermatology

## 2022-10-28 ENCOUNTER — Encounter: Admit: 2022-10-28 | Payer: PRIVATE HEALTH INSURANCE

## 2022-11-05 ENCOUNTER — Encounter: Admit: 2022-11-05 | Payer: PRIVATE HEALTH INSURANCE

## 2022-11-07 ENCOUNTER — Encounter: Admit: 2022-11-07 | Payer: PRIVATE HEALTH INSURANCE

## 2022-12-03 ENCOUNTER — Ambulatory Visit: Admit: 2022-12-03 | Payer: PRIVATE HEALTH INSURANCE | Attending: Hematology

## 2023-03-01 ENCOUNTER — Encounter: Admit: 2023-03-01 | Payer: PRIVATE HEALTH INSURANCE

## 2023-03-11 ENCOUNTER — Encounter: Admit: 2023-03-11 | Payer: PRIVATE HEALTH INSURANCE | Attending: Hematology

## 2023-03-11 ENCOUNTER — Inpatient Hospital Stay: Admit: 2023-03-11 | Discharge: 2023-03-11 | Payer: PRIVATE HEALTH INSURANCE

## 2023-03-11 ENCOUNTER — Ambulatory Visit: Admit: 2023-03-11 | Payer: PRIVATE HEALTH INSURANCE | Attending: Hematology

## 2023-03-11 ENCOUNTER — Ambulatory Visit: Admit: 2023-03-11 | Payer: PRIVATE HEALTH INSURANCE

## 2023-03-11 DIAGNOSIS — E7800 Pure hypercholesterolemia, unspecified: Secondary | ICD-10-CM

## 2023-03-11 DIAGNOSIS — D72820 Lymphocytosis (symptomatic): Secondary | ICD-10-CM

## 2023-03-11 DIAGNOSIS — F32A Depression: Secondary | ICD-10-CM

## 2023-03-11 DIAGNOSIS — C84A0 Cutaneous T-cell lymphoma, unspecified, unspecified site: Secondary | ICD-10-CM

## 2023-03-11 DIAGNOSIS — I10 Essential (primary) hypertension: Secondary | ICD-10-CM

## 2023-03-11 LAB — COMPREHENSIVE METABOLIC PANEL
BKR A/G RATIO: 1.9 (ref 1.0–2.2)
BKR ALANINE AMINOTRANSFERASE (ALT): 19 U/L (ref 10–35)
BKR ALBUMIN: 4.7 g/dL (ref 3.6–5.1)
BKR ALKALINE PHOSPHATASE: 56 U/L (ref 9–122)
BKR ANION GAP: 12 (ref 7–17)
BKR ASPARTATE AMINOTRANSFERASE (AST): 31 U/L (ref 10–35)
BKR AST/ALT RATIO: 1.6
BKR BILIRUBIN TOTAL: 0.4 mg/dL (ref <=1.2)
BKR BLOOD UREA NITROGEN: 18 mg/dL (ref 8–23)
BKR BUN / CREAT RATIO: 20 (ref 8.0–23.0)
BKR CALCIUM: 9.9 mg/dL (ref 8.8–10.2)
BKR CHLORIDE: 102 mmol/L (ref 98–107)
BKR CO2: 27 mmol/L (ref 20–30)
BKR CREATININE: 0.9 mg/dL (ref 0.40–1.30)
BKR EGFR, CREATININE (CKD-EPI 2021): >60 mL/min/{1.73_m2} (ref >=60)
BKR GLOBULIN: 2.5 g/dL (ref 2.0–3.9)
BKR GLUCOSE: 75 mg/dL (ref 70–100)
BKR POTASSIUM: 4.8 mmol/L (ref 3.3–5.3)
BKR PROTEIN TOTAL: 7.2 g/dL (ref 5.9–8.3)
BKR SODIUM: 141 mmol/L (ref 136–144)

## 2023-03-11 LAB — CBC WITH AUTO DIFFERENTIAL
BKR WAM ABSOLUTE IMMATURE GRANULOCYTES.: 0.03 x 1000/ÂµL (ref 0.00–0.30)
BKR WAM ABSOLUTE LYMPHOCYTE COUNT.: 2.58 x 1000/ÂµL (ref 0.60–3.70)
BKR WAM ABSOLUTE NRBC (2 DEC): 0 x 1000/ÂµL (ref 0.00–1.00)
BKR WAM ANC (ABSOLUTE NEUTROPHIL COUNT): 3.99 x 1000/ÂµL (ref 2.00–7.60)
BKR WAM BASOPHIL ABSOLUTE COUNT.: 0.03 x 1000/ÂµL (ref 0.00–1.00)
BKR WAM BASOPHILS: 0.4 % (ref 0.0–1.4)
BKR WAM EOSINOPHIL ABSOLUTE COUNT.: 0.14 x 1000/ÂµL (ref 0.00–1.00)
BKR WAM EOSINOPHILS: 1.9 % (ref 0.0–5.0)
BKR WAM HEMATOCRIT (2 DEC): 41.5 % (ref 35.00–45.00)
BKR WAM HEMOGLOBIN: 13 g/dL (ref 11.7–15.5)
BKR WAM IMMATURE GRANULOCYTES: 0.4 % (ref 0.0–1.0)
BKR WAM LYMPHOCYTES: 35.5 % (ref 17.0–50.0)
BKR WAM MCH (PG): 27.3 pg (ref 27.0–33.0)
BKR WAM MCHC: 31.3 g/dL (ref 31.0–36.0)
BKR WAM MCV: 87.2 fL (ref 80.0–100.0)
BKR WAM MONOCYTE ABSOLUTE COUNT.: 0.49 x 1000/ÂµL (ref 0.00–1.00)
BKR WAM MONOCYTES: 6.7 % (ref 4.0–12.0)
BKR WAM MPV: 9.1 fL (ref 8.0–12.0)
BKR WAM NEUTROPHILS: 55.1 % (ref 39.0–72.0)
BKR WAM NUCLEATED RED BLOOD CELLS: 0 % (ref 0.0–1.0)
BKR WAM PLATELETS: 238 x1000/ÂµL (ref 150–420)
BKR WAM RDW-CV: 14.4 % (ref 11.0–15.0)
BKR WAM RED BLOOD CELL COUNT.: 4.76 M/ÂµL (ref 4.00–6.00)
BKR WAM WHITE BLOOD CELL COUNT: 7.3 x1000/ÂµL (ref 4.0–11.0)

## 2023-03-11 LAB — LACTATE DEHYDROGENASE: BKR LACTATE DEHYDROGENASE: 149 U/L (ref 122–241)

## 2023-03-11 MED ORDER — SERTRALINE 50 MG TABLET
50 | ORAL_TABLET | Freq: Every day | ORAL | 3 refills | Status: AC
Start: 2023-03-11 — End: ?

## 2023-03-11 NOTE — Progress Notes
 Follow Patient EvaluationOctober 2, 2024NAME: Melissa Harper  MRN: [id number] DOB: 12-Jul-1962AGE: 62 y.o.                                              REFERRING PHYSICIAN: Dr. Lisette Grinder, [name], PA2 [address] [place],  [place] [id number] COMPLAINT: B cell lymphocytosisHPI:Melissa Harper is a 62 y.o. 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 The patient has had a lacy rash since 2017 and had multiple biopsies that were non diagnostic. She eventually had a repeat biopsy in 2021 where an abnormal T cell population was seen on IHC and TCR rearrangement studies suggested clonality. She established care at [facility] and had staging PET/[place] that was unrevealing. Her rash is not bothersome and she has found good relief with topical therapies. Earlier in 2023 she moved back to [place] from [place] due to her husbands health concerns and desires to be closer to family. She established CTCL follow up with Dr. Berdie Ogren and flow cytometry of her peripheral blood identified a small CD5-/CD10- B cell clone. Interim history:Melissa Harper returns for routine follow up. She has generally done well during the past 12+ months. She followed with Dr. Giardia q34months for her CTCL and is without any new skin lesions or concerns. She currently is without any B symptoms, adenopathy, early satiety or recurrent infections. She did have a few weeks of night sweats around June 2024, but these resolved with time.She had mild COVID over the summer. She plans to get flu vaccine later this fall. Her PS is 0 and she presents with her husband. VZD:GLOVFIEP focused review of systems is otherwise negative.PAST MEDICAL HISTORY:Past Medical History: Diagnosis Date  Depression   Hypercholesteremia   Hypertension  Autoimmune thyroditis MEDICATIONS:Outpatient Encounter Medications as of 03/11/2023 Medication Sig Dispense Refill  atorvastatin (LIPITOR) 10 mg tablet Take 1 tablet (10 mg total) by mouth every 24 hours.    clonazePAM (KLONOPIN) 0.5 mg tablet Take 1 tablet (0.5 mg total) by mouth daily as needed.    desvenlafaxine 100 mg Tb24 Take 100 mg by mouth daily. (Patient not taking: Reported on 03/31/2022)    levothyroxine (SYNTHROID) 100 MCG tablet Take 1 tablet (100 mcg total) by mouth every 24 hours.    losartan (COZAAR) 25 mg tablet Take 1 tablet (25 mg total) by mouth daily.    melatonin 5 mg Cap Take 10 mg by mouth daily.    meloxicam (MOBIC) 7.5 mg tablet take 1 tablet by mouth twice a day as needed for pain    tretinoin (RETIN-A) 0.1 % cream Apply 1 Application  topically as needed. (Patient not taking: Reported on 09/29/2022)    triamcinolone (KENALOG) 0.1 % cream Apply topically daily. Apply to affected area 454 g 3  trospium (SANCTURA) 20 mg tablet Take 1 tablet (20 mg total) by mouth 2 (two) times daily (0800, 1800).    VALCHLOR 0.016 % Gel Apply 1 Application topically daily. 60 g 2 No facility-administered encounter medications on file as of 03/11/2023. ALLERGIES:LisinoprilSOCIAL HISTORY:Social History Socioeconomic History  Marital status: Married Tobacco Use  Smoking status: Former   Current packs/day: 0.00   Average packs/day: 0.5 packs/day for 15.0 years (7.5 ttl pk-yrs)   Types: Cigarettes   Start date: 44   Quit date: 1997   Years since quitting: 61.7  Smokeless tobacco: Never Vaping Use  Vaping Use: Never used Substance and Sexual Activity  Alcohol use: Yes   Alcohol/week: 7.0 standard drinks of alcohol   Types: 7 Glasses of wine per week  Drug use: Not Currently   Types: Marijuana   Comment: smoke or gummies Retired from [place], previously lived in [place], recently moved back to [place] (husband with early Alzheimer) to be closer to family (step daughter, sisters). Currently building a home in [place]. FAMILY HISTORY:Family History Problem Relation Age of Onset  Lymphoma Mother   Breast cancer Mother   Diabetes Father       died from an insulin overdose  No Known Problems Sister   No Known Problems Brother   Diabetes Brother   Heart attack Brother  No autoimmune disease in her familyDLBCL and breast cancer in her mother age 72PHYSICAL EXAMINATION:BP (!) 161/93  - Pulse 81  - Temp 98.6 ?F (37 ?C) (Oral)  - Resp 19  - Ht 5' 7 (1.702 m)  - Wt 66.7 kg  - SpO2 97%  - BMI 23.03 kg/m? Wt Readings from Last 3 Encounters: 03/11/23 66.7 kg 12/04/21 64.7 kg Appearance Well appearing female, comfortable on room air, no acute distress Lymphatics no cervical, supraclavicular, axillary, or inguinal adenopathy Skin Warm and dry, no acute lesions. Very faint lacy rash on torso.  Eyes Sclera anicteric, pupils reactive to light, movements in tact ENT Clear OP without thrush, patent nares, no sinus tenderness Chest Moving air well, no wheeze/rale/rhonchi Heart Regular, rate 80, no murmur, rub or gallop Abdomen Soft, nontender, active bowel sounds, no splenomegaly Neuro Face symmetric, normal gait, non focal exam Musculoskeletal No joint swelling, erythema or warmth Extremities Warm and well perfused, no edema Psychiatric normal affect, bright mood, good insight and judgement LABORATORY: I have reviewed the labs and have summarized them below for our records. Lab Results Component Value Date  WBC 7.3 03/11/2023  RBC 4.76 03/11/2023  HGB 13.0 03/11/2023  HCT 41.50 03/11/2023  MCV 87.2 03/11/2023  MCH 27.3 03/11/2023  MCHC 31.3 03/11/2023  PLT 238 03/11/2023  MPV 9.1 03/11/2023 SPEP/IFE/light chains at initial consult 2023 were normalPATHOLOGY DATA: Pathology reviewed and is summarized below for our records. 11/07/2021 - peripheral blood flow cytometryInvolvement of blood [7-8% of cells] with monoclonal B cell lymphoproliferative disease. The immunophenotype (CD19+ CD20+ CD5- CD10- FMC7+ IGM+ KAPPA+ CD11c- CD103-) is not specific for any single NHL subtype but is not consistent with most cases of chronic lymphocytic leukemia, mantle cell lymphoma or hairy cell leukemia. Essentially normal lymphoid immunophenotype with no phenotypically unique cells suggestive of T-cell lymphoproliferative disorder. RADIOLOGY REVIEW: Images reviewed and are summarized below for our records. 07/03/20 - PET/[place] skull base to mid thighI reviewed the scan. She has diffuse thyroid uptake (known thyroditis). Otherwise no abnormal FDG uptake5/8/23 - [place] A/P1.  Diverticulosis of the sigmoid and descending colon with no evidence of diverticulitis. 2.  Status post right nephrectomy and hysterectomy. 3. No splenomegaly or adenopathyIMPRESSION(S):Melissa Harper is a 62 y.o. 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 Her blood work and recent imaging (PET/[place] Jan 2022 and [place] A/P 10/2021) was unrevealing.Much of the initial consultation visit was used to discuss the diagnosis and natural history of monoclonal B cell lymphocytosis. This is an increasingly recognized finding due to increased utilization of high sensitivity flow cytometry. A recent study from [facility] screened over 10,000 individuals over age 19 and  found clonal B cells (MBL) in 17% of individuals - with increased prevelence by age (16% in those age 32-69, 24% in those age 28-79 years, 28% in 80-89, and 42% in >90 years). They observed similar survival among individuals with and without MBL, yet individuals with MBL did have a fourfold increased risk of lymphoid malignancies. [Slager et al. Blood (2022) 140 (15): [id number].]We reviewed her prior work up from [facility] form January 2022 and it shows she had peripheral blood involvement at that time by the identical CD5/CD10- B cell clone. We discussed that finding an MBL is quite common and likely of greater prevalence in individuals with autoimmune conditions. We discussed that individuals with MBL (and autoimmune conditions) do have higher risk of future lymphoma, but that the overall incidence remains quite low. Melissa Harper returns for routine follow up and has generally felt well. Labs and exam today are reassuring and surveillance remains indicated. PLAN(S):1. I plan to see her back in 12 months for MBL follow up. She was encouraged to call in the interim with any concerns. 2. Appreciate ongoing f/u with dermatology for her CTCL3. Encouraged aged appropriate cancer screening. Mammogram is UTD, colonoscopy Nov 2022 (q5years due to history of polyps). 4. We reviewed COVID pandemic in detail, including breakthrough infections and benefit of early of COVID antivirals/antibodies (when available) in event of illness. Patient will continue to follow with me for ongoing/longitudinal care related to medical condition(s)  monoclonal B cell lymphocytosis .I spent 24  minutes on day of visit, including time used to review/prepare for visit, direct face-to-face evaluation, and day of visit documentation. IE:PPIRJJO, Rayann Heman [address] [place],  [place] [address]

## 2023-04-02 ENCOUNTER — Encounter: Admit: 2023-04-02 | Payer: PRIVATE HEALTH INSURANCE | Attending: Dermatology

## 2023-04-02 DIAGNOSIS — C84A0 Cutaneous T-cell lymphoma, unspecified, unspecified site: Secondary | ICD-10-CM

## 2023-04-02 NOTE — Progress Notes
 CC: Melissa Harper is a 62 y.o. female being seen in follow-up for Chief Complaint Patient presents with  Total Body Skin Exam HPI:  Presents for TBSE. She has a history of CTCL.  She has been using Valchlor 0.016% gel biw.  She states that lesions are unchanged.She has noticed increased activity on lower back.  She reports a family history of lymphoma, type unknown (mother).  ZOX:WRUE Medical History: Diagnosis Date  Depression   Hypercholesteremia   Hypertension  Medications:Current Outpatient Medications Medication Sig Dispense Refill  atorvastatin (LIPITOR) 10 mg tablet Take 1 tablet (10 mg total) by mouth every 24 hours.    clonazePAM (KLONOPIN) 0.5 mg tablet Take 1 tablet (0.5 mg total) by mouth daily as needed.    levothyroxine (SYNTHROID) 100 MCG tablet Take 1 tablet (100 mcg total) by mouth every 24 hours.    losartan (COZAAR) 25 mg tablet Take 1 tablet (25 mg total) by mouth daily.    melatonin 5 mg Cap Take 10 mg by mouth daily.    meloxicam (MOBIC) 7.5 mg tablet take 1 tablet by mouth twice a day as needed for pain    sertraline (ZOLOFT) 50 mg tablet Take 1.5 tablets (75 mg total) by mouth daily. 30 tablet 2  tretinoin (RETIN-A) 0.1 % cream Apply 1 Application  topically as needed. (Patient not taking: Reported on 09/29/2022)    triamcinolone (KENALOG) 0.1 % cream Apply topically daily. Apply to affected area 454 g 3  trospium (SANCTURA) 20 mg tablet Take 1 tablet (20 mg total) by mouth 2 (two) times daily (0800, 1800).    VALCHLOR 0.016 % Gel Apply 1 Application topically daily. 60 g 2 No current facility-administered medications for this visit. Review of Systems:The following were asked of the patient: unintentional weight loss, fever, chest pain, cough, shortness of breath, nausea, vomiting, abdominal pain, lumps, bumps, headaches, weakness, or enlarging lymph node. All were negative. Physical Exam:Skin examination was performed. Pertinent physical findings were noted as recorded on the attached body diagram. Patient exam or treatment required medical chaperone.The sensitive parts of the examination were performed with chaperone present: [name] R. 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

## 2023-06-23 ENCOUNTER — Encounter: Admit: 2023-06-23 | Payer: PRIVATE HEALTH INSURANCE

## 2023-06-23 MED ORDER — VALCHLOR 0.016 % TOPICAL GEL
0.016 | 3 refills | Status: AC
Start: 2023-06-23 — End: ?

## 2023-10-05 ENCOUNTER — Encounter: Admit: 2023-10-05 | Payer: PRIVATE HEALTH INSURANCE | Attending: Dermatology

## 2023-10-05 DIAGNOSIS — C84A0 Cutaneous T-cell lymphoma, unspecified, unspecified site: Secondary | ICD-10-CM

## 2023-10-05 DIAGNOSIS — C8400 Mycosis fungoides, unspecified site: Secondary | ICD-10-CM

## 2023-10-05 NOTE — Progress Notes
 CC: Melissa Harper is a 63 y.o. female being seen in follow-up for Chief Complaint Patient presents with  Total Body Skin Exam HPI:  Presents for TBSE. She has a history of CTCL.  She has been using Valchlor  0.016% gel q wk - biwShe has not used the triamcinolone  0.1% crm in the interim.She reports a family history of lymphoma, type unknown (mother).  WJX:BJYN Medical History: Diagnosis Date  Depression   Hypercholesteremia   Hypertension  Medications:Current Outpatient Medications Medication Sig Dispense Refill  atorvastatin  (LIPITOR) 10 mg tablet Take 1 tablet (10 mg total) by mouth every 24 hours.    clonazePAM  (KLONOPIN ) 0.5 mg tablet Take 1 tablet (0.5 mg total) by mouth daily as needed.    levothyroxine  (SYNTHROID ) 100 MCG tablet Take 1 tablet (100 mcg total) by mouth every 24 hours.    losartan  (COZAAR ) 25 mg tablet Take 1 tablet (25 mg total) by mouth daily.    melatonin 5 mg Cap Take 10 mg by mouth daily.    meloxicam  (MOBIC ) 7.5 mg tablet take 1 tablet by mouth twice a day as needed for pain    sertraline  (ZOLOFT ) 50 mg tablet Take 1.5 tablets (75 mg total) by mouth daily. 30 tablet 2  VALCHLOR  0.016 % Gel APPLY A THIN FILM ONCE DAILY TO AFFECTED AREAS OF THE SKIN 60 g 2  tretinoin  (RETIN-A ) 0.1 % cream Apply 1 Application  topically as needed. (Patient not taking: Reported on 10/05/2023)    triamcinolone  (KENALOG ) 0.1 % cream Apply topically daily. Apply to affected area (Patient not taking: Reported on 10/05/2023) 454 g 3  trospium  (SANCTURA ) 20 mg tablet Take 1 tablet (20 mg total) by mouth 2 (two) times daily (0800, 1800).   No current facility-administered medications for this visit. Review of Systems:The following were asked of the patient: unintentional weight loss, fever, chest pain, cough, shortness of breath, nausea, vomiting, abdominal pain, lumps, bumps, headaches, weakness, or enlarging lymph node. All were negative. Physical Exam:Skin examination was performed. Pertinent physical findings were noted as recorded on the attached body diagram. Patient exam or treatment required medical chaperone.The sensitive parts of the examination were performed with chaperone present: Kacie R. 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

## 2023-11-30 ENCOUNTER — Telehealth: Admit: 2023-11-30 | Payer: PRIVATE HEALTH INSURANCE | Attending: Hematology

## 2023-11-30 NOTE — Telephone Encounter
 Called patient to reschedule appointment on 03/09/2024  from Dr. Marcheta schedule per rescheduling email received on 11/30/2023 ( provider will not be having clinic this date ,Provider is attending  ) , rescheduled appointment  to next available opening ,LVM to notify of change

## 2024-03-09 ENCOUNTER — Ambulatory Visit: Admit: 2024-03-09 | Payer: PRIVATE HEALTH INSURANCE

## 2024-03-09 ENCOUNTER — Ambulatory Visit: Admit: 2024-03-09 | Payer: PRIVATE HEALTH INSURANCE | Attending: Hematology

## 2024-04-11 ENCOUNTER — Encounter: Admit: 2024-04-11 | Payer: PRIVATE HEALTH INSURANCE | Attending: Dermatology

## 2024-04-11 DIAGNOSIS — C8400 Mycosis fungoides, unspecified site: Secondary | ICD-10-CM

## 2024-04-11 DIAGNOSIS — C84A0 Cutaneous T-cell lymphoma, unspecified, unspecified site: Principal | ICD-10-CM

## 2024-04-11 MED ORDER — CLONAZEPAM 0.5 MG TABLET
0.5 | ORAL | 1.00 refills | 7.00000 days | Status: AC
Start: 2024-04-11 — End: ?

## 2024-04-11 NOTE — Progress Notes [1]
 CC: Melissa Harper is a 63 y.o. female being seen in follow-up for Chief Complaint Patient presents with  Total Body Skin Exam HPI:  Presents for TBSE. She has a history of CTCL\/MF.  She has been using Valchlor  0.016% gel q wk - biw - but did not need it over the summer.  She states now that cooler weather is here that she is getting some flares.  She has not used triamcinolone .  She continues to see Dr. Bette for her monoclonal B cell lymphocytosis EFY:Ejdu Medical History[1]Medications:Current Medications[2]Review of Systems:The following were asked of the patient: unintentional weight loss, fever, chest pain, cough, shortness of breath, nausea, vomiting, abdominal pain, lumps, bumps, headaches, weakness, or enlarging lymph node. All were negative. Physical Exam:Skin examination was performed. Pertinent physical findings were noted as recorded on the attached body diagram. Patient exam or treatment required medical chaperone.The sensitive parts of the examination were performed with chaperone present: Kacie R. 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 Glendia HuntingtonReturn for TBSE in 6 months or sooner if LOC.  [1] Past Medical History:Diagnosis Date  Depression   Hypercholesteremia   Hypertension  [2] Current Outpatient Medications Medication Sig Dispense Refill  clonazePAM  (KLONOPIN ) 0.5 mg tablet Take 1 tablet (0.5 mg total) by mouth.    atorvastatin  (LIPITOR) 10 mg tablet Take 1 tablet (10 mg total) by mouth every 24 hours.    clonazePAM  (KLONOPIN ) 0.5 mg tablet Take 1 tablet (0.5 mg total) by mouth daily as needed.    levothyroxine  (SYNTHROID ) 100 MCG tablet Take 1 tablet (100 mcg total) by mouth every 24 hours.    losartan  (COZAAR ) 25 mg tablet Take 1 tablet (25 mg total) by mouth daily.    melatonin 5 mg Cap Take 10 mg by mouth daily.    meloxicam  (MOBIC ) 7.5 mg tablet take 1 tablet by mouth twice a day as needed for pain    sertraline  (ZOLOFT ) 50 mg tablet Take 1.5 tablets (75 mg total) by mouth daily. 30 tablet 2  tretinoin  (RETIN-A ) 0.1 % cream Apply 1 Application  topically as needed. (Patient not taking: Reported on 10/05/2023)    triamcinolone  (KENALOG ) 0.1 % cream Apply topically daily. Apply to affected area (Patient not taking: Reported on 10/05/2023) 454 g 3  trospium  (SANCTURA ) 20 mg tablet Take 1 tablet (20 mg total) by mouth 2 (two) times daily (0800, 1800).    VALCHLOR  0.016 % Gel APPLY A THIN FILM ONCE DAILY TO AFFECTED AREAS OF THE SKIN 60 g 2 No current facility-administered medications for this visit.

## 2024-04-13 ENCOUNTER — Inpatient Hospital Stay: Admit: 2024-04-13 | Discharge: 2024-04-13 | Payer: Managed Care (Private)

## 2024-04-13 ENCOUNTER — Encounter: Admit: 2024-04-13 | Payer: PRIVATE HEALTH INSURANCE | Attending: Hematology

## 2024-04-13 ENCOUNTER — Ambulatory Visit: Admit: 2024-04-13 | Payer: PRIVATE HEALTH INSURANCE | Attending: Hematology

## 2024-04-13 ENCOUNTER — Ambulatory Visit: Admit: 2024-04-13 | Payer: PRIVATE HEALTH INSURANCE

## 2024-04-13 VITALS — BP 161/102 | HR 65 | Temp 98.60000°F | Resp 20 | Ht 68.0 in | Wt 169.3 lb

## 2024-04-13 DIAGNOSIS — D72820 Lymphocytosis (symptomatic): Secondary | ICD-10-CM

## 2024-04-13 DIAGNOSIS — C84A0 Cutaneous T-cell lymphoma, unspecified, unspecified site: Principal | ICD-10-CM

## 2024-04-13 DIAGNOSIS — I10 Essential (primary) hypertension: Principal | ICD-10-CM

## 2024-04-13 DIAGNOSIS — F32A Depression: Secondary | ICD-10-CM

## 2024-04-13 DIAGNOSIS — E7800 Pure hypercholesterolemia, unspecified: Secondary | ICD-10-CM

## 2024-04-13 LAB — COMPREHENSIVE METABOLIC PANEL
BKR A/G RATIO: 2 (ref 1.0–2.2)
BKR ALANINE AMINOTRANSFERASE (ALT): 26 U/L (ref 10–35)
BKR ALBUMIN: 4.9 g/dL (ref 3.6–5.1)
BKR ALKALINE PHOSPHATASE: 62 U/L (ref 9–122)
BKR ANION GAP: 12 (ref 7–17)
BKR ASPARTATE AMINOTRANSFERASE (AST): 29 U/L (ref 10–35)
BKR AST/ALT RATIO: 1.1
BKR BILIRUBIN TOTAL: 0.6 mg/dL (ref <=1.2)
BKR BLOOD UREA NITROGEN: 13 mg/dL (ref 8–23)
BKR BUN / CREAT RATIO: 18.8 (ref 8.0–23.0)
BKR CALCIUM: 10 mg/dL (ref 8.8–10.2)
BKR CHLORIDE: 104 mmol/L (ref 98–107)
BKR CO2: 25 mmol/L (ref 20–30)
BKR CREATININE: 0.69 mg/dL (ref 0.40–1.30)
BKR EGFR, CREATININE (CKD-EPI 2021): >60 mL/min/1.73m2 (ref >=60)
BKR GLOBULIN: 2.4 g/dL (ref 2.0–3.9)
BKR GLUCOSE: 87 mg/dL (ref 70–100)
BKR POTASSIUM: 4.6 mmol/L (ref 3.3–5.3)
BKR PROTEIN TOTAL: 7.3 g/dL (ref 5.9–8.3)
BKR SODIUM: 141 mmol/L (ref 136–144)

## 2024-04-13 LAB — CBC WITH AUTO DIFFERENTIAL
BKR WAM ABSOLUTE IMMATURE GRANULOCYTES.: 0.03 x 1000/ÂµL (ref 0.00–0.30)
BKR WAM ABSOLUTE LYMPHOCYTE COUNT.: 3.04 x 1000/ÂµL (ref 0.60–3.70)
BKR WAM ABSOLUTE NRBC: 0 x 1000/ÂµL (ref 0.00–1.00)
BKR WAM ANC (ABSOLUTE NEUTROPHIL COUNT): 2.5 x 1000/ÂµL (ref 2.00–7.60)
BKR WAM BASOPHIL ABSOLUTE COUNT.: 0.03 x 1000/ÂµL (ref 0.00–1.00)
BKR WAM BASOPHILS: 0.5 % (ref 0.0–1.4)
BKR WAM EOSINOPHIL ABSOLUTE COUNT.: 0.22 x 1000/ÂµL (ref 0.00–1.00)
BKR WAM EOSINOPHILS: 3.4 % (ref 0.0–5.0)
BKR WAM HEMATOCRIT: 42.4 % (ref 35.00–45.00)
BKR WAM HEMOGLOBIN: 13.2 g/dL (ref 11.7–15.5)
BKR WAM IMMATURE GRANULOCYTES: 0.5 % (ref 0.0–1.0)
BKR WAM LYMPHOCYTES: 46.9 % (ref 17.0–50.0)
BKR WAM MCH: 27 pg (ref 27.0–33.0)
BKR WAM MCHC: 31.1 g/dL (ref 31.0–36.0)
BKR WAM MCV: 86.9 fL (ref 80.0–100.0)
BKR WAM MONOCYTE ABSOLUTE COUNT.: 0.66 x 1000/ÂµL (ref 0.00–1.00)
BKR WAM MONOCYTES: 10.2 % (ref 4.0–12.0)
BKR WAM MPV: 9.1 fL (ref 8.0–12.0)
BKR WAM NEUTROPHILS: 38.5 % — ABNORMAL LOW (ref 39.0–72.0)
BKR WAM NUCLEATED RED BLOOD CELLS: 0 % (ref 0.0–1.0)
BKR WAM PLATELETS: 267 x1000/ÂµL (ref 150–420)
BKR WAM RDW-CV: 13.2 % (ref 11.0–15.0)
BKR WAM RED BLOOD CELL COUNT.: 4.88 M/ÂµL (ref 4.00–6.00)
BKR WAM WHITE BLOOD CELL COUNT: 6.5 x1000/ÂµL (ref 4.0–11.0)

## 2024-04-13 LAB — LACTATE DEHYDROGENASE: BKR LACTATE DEHYDROGENASE: 164 U/L (ref 122–241)

## 2024-04-13 MED ORDER — MIRABEGRON ER 50 MG TABLET,EXTENDED RELEASE 24 HR
50 | Freq: Every day | ORAL | 8.00 refills | 30.00000 days | Status: AC
Start: 2024-04-13 — End: ?

## 2024-04-13 NOTE — Progress Notes [1]
 Follow Patient EvaluationNovember 5, 2025NAMEXitlally Harper  MRN: [id number] DOB: 1962/07/26AGE: 63 y.o.                                              REFERRING PHYSICIAN: Dr. Fernando, Kacie, PA2 [address] [place],  [place] [id number] COMPLAINT: B cell lymphocytosisHPI:Melissa Harper is a 63 y.o. 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 The patient has had a lacy rash since 2017 and had multiple biopsies that were non diagnostic. She eventually had a repeat biopsy in 2021 where an abnormal T cell population was seen on IHC and TCR rearrangement studies suggested clonality. She established care at [facility] and had staging PET/[place] that was unrevealing. Her rash is not bothersome and she has found good relief with topical therapies. Earlier in 2023 she moved back to [place] from [place]  due to her husbands health concerns and desires to be closer to family. She established CTCL follow up with Dr. Bethanie and flow cytometry of her peripheral blood identified a small CD5-/CD10- B cell clone. Interim history:Melissa Harper returns for routine follow up. She has generally done well during the past 12 months. She followed with Dr. Sabina last week for her CTCL and is using topical therapy for a few small lesions with good control. Plans to continue q47month visits with dermatology. She currently is without any B symptoms, adenopathy, early satiety or recurrent infections. Her prior night sweats have resolved with reduction in her zoloft  to 50 mg daily. Her PS is 0 and she presents alone. MND:Obfeynfj focused review of systems is otherwise negative.PAST MEDICAL HISTORY:Past Medical History: Diagnosis Date  Depression   Hypercholesteremia   Hypertension  Autoimmune thyroditis MEDICATIONS:Outpatient Encounter Medications as of 04/13/2024 Medication Sig Dispense Refill  atorvastatin  (LIPITOR) 10 mg tablet Take 1 tablet (10 mg total) by mouth every 24 hours.    clonazePAM  (KLONOPIN ) 0.5 mg tablet Take 1 tablet (0.5 mg total) by mouth daily as needed.    clonazePAM  (KLONOPIN ) 0.5 mg tablet Take 1 tablet (0.5 mg total) by mouth.    levothyroxine  (SYNTHROID ) 100 MCG tablet Take 1 tablet (100 mcg total) by mouth every 24 hours.    losartan  (COZAAR ) 25 mg tablet Take 1 tablet (25 mg total) by mouth daily.    melatonin 5 mg Cap Take 10 mg by mouth daily.    meloxicam  (MOBIC ) 7.5 mg tablet take 1 tablet by mouth twice a day as needed for pain    mirabegron (MYRBETRIQ) 50 mg extended release tablet 24 hr Take 1 tablet (50 mg total) by mouth daily.    sertraline  (ZOLOFT ) 50 mg tablet Take 1.5 tablets (75 mg total) by mouth daily. 30 tablet 2  VALCHLOR  0.016 % Gel APPLY A THIN FILM ONCE DAILY TO AFFECTED AREAS OF THE SKIN 60 g 2  [DISCONTINUED] tretinoin  (RETIN-A ) 0.1 % cream Apply 1 Application  topically as needed. (Patient not taking: Reported on 10/05/2023)    [DISCONTINUED] triamcinolone  (KENALOG ) 0.1 % cream Apply topically daily. Apply to affected area (Patient not taking: Reported on 10/05/2023) 454 g 3  [DISCONTINUED] trospium  (SANCTURA ) 20 mg tablet Take 1 tablet (20 mg total) by mouth 2 (two) times daily (0800, 1800).   No facility-administered encounter medications on file as of 04/13/2024. ALLERGIES:LisinoprilSOCIAL HISTORY:Social History Socioeconomic History  Marital status:  Married Tobacco Use  Smoking status: Former   Current packs/day: 0.00   Average packs/day: 0.5 packs/day for 15.0 years (7.5 ttl pk-yrs)   Types: Cigarettes   Start date: 58   Quit date: 1997   Years since quitting: 28.8  Smokeless tobacco: Never Vaping Use  Vaping status: Never Used Substance and Sexual Activity  Alcohol use: Yes   Alcohol/week: 7.0 standard drinks of alcohol   Types: 7 Glasses of wine per week Drug use: Not Currently   Types: Marijuana   Comment: smoke or gummies Social Drivers of Health Food Insecurity: No Food Insecurity (11/23/2023)  Received from [employer] Vital Sign   Worried About Running Out of Food in the Last Year: Never true   Ran Out of Food in the Last Year: Never true Transportation Needs: No Transportation Needs (11/23/2023)  Received from [employer] - Transportation   Lack of Transportation (Medical): No   Lack of Transportation (Non-Medical): No Social Connections: Unknown (11/23/2023)  Received from [employer] and Isolation Panel [NHANES]   Frequency of Communication with Friends and Family: More than three times a week Housing Stability: Low Risk (11/23/2023)  Received from [employer] Stability Vital Sign   Unable to Pay for Housing in the Last Year: No   Number of Times Moved in the Last Year: 0   Homeless in the Last Year: No Retired from [employer], previously lived in [place] , recently moved back to [place] (husband with early Alzheimer) to be closer to family (step daughter, sisters). Currently building a home in [place]. FAMILY HISTORY:Family History Problem Relation Age of Onset  Lymphoma Mother   Breast cancer Mother   Diabetes Father       died from an insulin overdose  No Known Problems Sister   No Known Problems Brother   Diabetes Brother   Heart attack Brother  No autoimmune disease in her familyDLBCL and breast cancer in her mother age 72PHYSICAL EXAMINATION:BP (!) 161/102  - Pulse 65  - Temp 98.6 ?F (37 ?C) (Oral)  - Resp 20  - Ht 5' 8 (1.727 m)  - Wt 76.8 kg  - SpO2 99%  - BMI 25.74 kg/m?  She checks BPs at home that are typically WNLWt Readings from Last 3 Encounters: 04/13/24 76.8 kg 03/11/23 66.7 kg 12/04/21 64.7 kg Appearance Well appearing female, comfortable on room air, no acute distress Lymphatics no cervical, supraclavicular, axillary, or inguinal adenopathy Skin Warm and dry, no acute lesions. Very faint lacy rash on torso.  Eyes Sclera anicteric, pupils reactive to light, movements in tact ENT Clear OP without thrush, patent nares, no sinus tenderness Chest Moving air well, no wheeze/rale/rhonchi Heart Regular, rate 60, no murmur, rub or gallop Abdomen Soft, nontender, active bowel sounds, no splenomegaly Neuro Face symmetric, normal gait, non focal exam Musculoskeletal No joint swelling, erythema or warmth Extremities Warm and well perfused, no edema Psychiatric normal affect, bright mood, good insight and judgement LABORATORY: I have reviewed the labs and have summarized them below for our records. CBC and CMP are excellentLab Results Component Value Date  WBC 6.5 04/13/2024  RBC 4.88 04/13/2024  HGB 13.2 04/13/2024  HCT 42.40 04/13/2024  MCV 86.9 04/13/2024  MCH 27.0 04/13/2024  MCHC 31.1 04/13/2024  PLT 267 04/13/2024  MPV 9.1 04/13/2024 Results in Past 7 DaysResult Component Current Result LD 164 (04/13/2024) SPEP/IFE/light chains at initial consult 2023 were normalPATHOLOGY DATA: Pathology reviewed and is summarized below for our  records. 11/07/2021 - peripheral blood flow cytometryInvolvement of blood [7-8% of cells] with monoclonal B cell lymphoproliferative disease. The immunophenotype (CD19+ CD20+ CD5- CD10- FMC7+ IGM+ KAPPA+ CD11c- CD103-) is not specific for any single NHL subtype but is not consistent with most cases of chronic lymphocytic leukemia, mantle cell lymphoma or hairy cell leukemia. Essentially normal lymphoid immunophenotype with no phenotypically unique cells suggestive of T-cell lymphoproliferative disorder. RADIOLOGY REVIEW: Images reviewed and are summarized below for our records. 07/03/20 - PET/[place] skull base to mid thighI reviewed the scan. She has diffuse thyroid uptake (known thyroditis). Otherwise no abnormal FDG uptake5/8/23 - [place] A/P1.  Diverticulosis of the sigmoid and descending colon with no evidence of diverticulitis. 2.  Status post right nephrectomy and hysterectomy. 3. No splenomegaly or adenopathyIMPRESSION(S):Jermaine Leap is a 63 y.o. 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 Her blood work and recent imaging (PET/[place] Jan 2022 and [place] A/P 10/2021) was unrevealing.Much of the initial consultation visit was used to discuss the diagnosis and natural history of monoclonal B cell lymphocytosis. This is an increasingly recognized finding due to increased utilization of high sensitivity flow cytometry. A recent study from [facility] screened over 10,000 individuals over age 34 and found clonal B cells (MBL) in 17% of individuals - with increased prevelence by age (16% in those age 63-69, 24% in those age 4-79 years, 28% in 80-89, and 42% in >90 years). They observed similar survival among individuals with and without MBL, yet individuals with MBL did have a fourfold increased risk of lymphoid malignancies. [Slager et al. Blood (2022) 140 (15): [id number].]We reviewed her prior work up from [facility] form January 2022 and it shows she had peripheral blood involvement at that time by the identical CD5/CD10- B cell clone. We discussed that finding an MBL is quite common and likely of greater prevalence in individuals with autoimmune conditions. We discussed that individuals with MBL (and autoimmune conditions) do have higher risk of future lymphoma, but that the overall incidence remains quite low. Melissa Harper returns for routine follow up and has generally felt well. Labs and exam today are reassuring and surveillance remains indicated. PLAN(S):1. She will have labs in 6 months and I plan to see her back in 12 months for MBL follow up. She was encouraged to call in the interim with any concerns. 2. Appreciate ongoing f/u with dermatology for her CTCL3. Encouraged aged appropriate cancer screening. Mammogram is UTD, colonoscopy Nov 2022 (q5years due to history of polyps). 4. I encouraged routine immunizations - she is UTD with flu, covid, RSV and shingles. May benefit from Prevnar 20. Patient will continue to follow with me for ongoing/longitudinal care related to medical condition(s) monoclonal B cell lymphocytosis.I spent 20  minutes on day of visit, including time used to review/prepare for visit, direct face-to-face evaluation, and day of visit documentation. Michaeljohn Biss Huntington11/10/2023 1:32 PMCC:Carlson, Kacie, PA[address] [place],  [place] [id number]

## 2024-04-13 NOTE — Patient Instructions [37]
 You are doing great.I would like you to have a complete blood count (CBC) approximately every 6 months. You could have it next in May when seeing Dr. Bethanie. I will see you again in about 12 months.

## 2024-04-14 ENCOUNTER — Encounter: Admit: 2024-04-14 | Payer: PRIVATE HEALTH INSURANCE | Attending: Dermatology

## 2024-04-15 ENCOUNTER — Encounter: Admit: 2024-04-15 | Payer: PRIVATE HEALTH INSURANCE

## 2024-04-15 DIAGNOSIS — C8400 Mycosis fungoides, unspecified site: Secondary | ICD-10-CM

## 2024-04-15 DIAGNOSIS — C84A0 Cutaneous T-cell lymphoma, unspecified, unspecified site: Principal | ICD-10-CM

## 2024-04-15 MED ORDER — VALCHLOR 0.016 % TOPICAL GEL
0.016 | TOPICAL | 3 refills | Status: AC
Start: 2024-04-15 — End: ?

## 2024-07-04 ENCOUNTER — Telehealth: Admit: 2024-07-04 | Payer: PRIVATE HEALTH INSURANCE | Attending: Hematology
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
## 2024-10-10 ENCOUNTER — Encounter: Admit: 2024-10-10 | Payer: PRIVATE HEALTH INSURANCE | Attending: Dermatology
